# Patient Record
Sex: Male | Born: 1985 | Race: White | Hispanic: No | Marital: Single | Smoking: Current every day smoker
Health system: Southern US, Community
[De-identification: ages and names within clinical notes are randomized; demographics above are authoritative.]

## PROBLEM LIST (undated history)

## (undated) DIAGNOSIS — A4901 Methicillin susceptible Staphylococcus aureus infection, unspecified site: Secondary | ICD-10-CM

## (undated) DIAGNOSIS — F1990 Other psychoactive substance use, unspecified, uncomplicated: Secondary | ICD-10-CM

## (undated) DIAGNOSIS — R7881 Bacteremia: Secondary | ICD-10-CM

---
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
## 2010-12-22 ENCOUNTER — Emergency Department: Payer: Self-pay | Admitting: Emergency Medicine

## 2011-02-11 ENCOUNTER — Emergency Department: Payer: Self-pay | Admitting: Emergency Medicine

## 2011-03-08 ENCOUNTER — Emergency Department (HOSPITAL_COMMUNITY)
Admission: EM | Admit: 2011-03-08 | Discharge: 2011-03-08 | Disposition: A | Discharge status: Home / Self Care | Payer: Self-pay | Attending: Emergency Medicine | Admitting: Emergency Medicine

## 2011-03-08 DIAGNOSIS — R42 Dizziness and giddiness: Secondary | ICD-10-CM | POA: Insufficient documentation

## 2012-06-02 ENCOUNTER — Emergency Department (HOSPITAL_COMMUNITY)
Admission: EM | Admit: 2012-06-02 | Discharge: 2012-06-02 | Discharge status: Against Medical Advice | Payer: Self-pay | Attending: Emergency Medicine | Admitting: Emergency Medicine

## 2012-06-02 ENCOUNTER — Encounter (HOSPITAL_COMMUNITY): Payer: Self-pay | Admitting: *Deleted

## 2012-06-02 DIAGNOSIS — Z5900 Homelessness unspecified: Secondary | ICD-10-CM | POA: Insufficient documentation

## 2012-06-02 DIAGNOSIS — Z008 Encounter for other general examination: Secondary | ICD-10-CM | POA: Insufficient documentation

## 2012-06-02 NOTE — ED Notes (Signed)
Pt states his grandmother is about to get off work and works close to hospital. He left AMA so that he can see if his grandmother can help him since he is homeless, if not pt will return to hospital.

## 2012-06-02 NOTE — ED Notes (Signed)
Pt states he has been smoking medical marijuana and wants to be placed in a rehab facility. States he is homeless, recently lost job and needs help with drugs. Pt anxious in triage.

## 2013-08-01 ENCOUNTER — Encounter (HOSPITAL_COMMUNITY): Payer: Self-pay | Admitting: *Deleted

## 2013-08-01 ENCOUNTER — Emergency Department (HOSPITAL_COMMUNITY)
Admission: EM | Admit: 2013-08-01 | Discharge: 2013-08-01 | Disposition: A | Discharge status: Home / Self Care | Payer: Self-pay | Attending: Emergency Medicine | Admitting: Emergency Medicine

## 2013-08-01 ENCOUNTER — Emergency Department (HOSPITAL_COMMUNITY): Payer: Self-pay

## 2013-08-01 DIAGNOSIS — R059 Cough, unspecified: Secondary | ICD-10-CM | POA: Insufficient documentation

## 2013-08-01 DIAGNOSIS — R093 Abnormal sputum: Secondary | ICD-10-CM | POA: Insufficient documentation

## 2013-08-01 DIAGNOSIS — R05 Cough: Secondary | ICD-10-CM | POA: Insufficient documentation

## 2013-08-01 DIAGNOSIS — F17200 Nicotine dependence, unspecified, uncomplicated: Secondary | ICD-10-CM | POA: Insufficient documentation

## 2013-08-01 DIAGNOSIS — J3489 Other specified disorders of nose and nasal sinuses: Secondary | ICD-10-CM | POA: Insufficient documentation

## 2013-08-01 DIAGNOSIS — R1110 Vomiting, unspecified: Secondary | ICD-10-CM | POA: Insufficient documentation

## 2013-08-01 DIAGNOSIS — J209 Acute bronchitis, unspecified: Secondary | ICD-10-CM | POA: Insufficient documentation

## 2013-08-01 DIAGNOSIS — R062 Wheezing: Secondary | ICD-10-CM | POA: Insufficient documentation

## 2013-08-01 DIAGNOSIS — Z8701 Personal history of pneumonia (recurrent): Secondary | ICD-10-CM | POA: Insufficient documentation

## 2013-08-01 DIAGNOSIS — R0989 Other specified symptoms and signs involving the circulatory and respiratory systems: Secondary | ICD-10-CM | POA: Insufficient documentation

## 2013-08-01 DIAGNOSIS — Z720 Tobacco use: Secondary | ICD-10-CM

## 2013-08-01 DIAGNOSIS — R197 Diarrhea, unspecified: Secondary | ICD-10-CM | POA: Insufficient documentation

## 2013-08-01 MED ORDER — AEROCHAMBER Z-STAT PLUS/MEDIUM MISC: 1.0000 | Freq: Once | Status: DC

## 2013-08-01 MED ORDER — PREDNISONE 20 MG PO TABS: 20.0000 mg | ORAL_TABLET | Freq: Two times a day (BID) | ORAL | Status: DC

## 2013-08-01 MED ORDER — ALBUTEROL SULFATE HFA 108 (90 BASE) MCG/ACT IN AERS
2.0000 | INHALATION_SPRAY | RESPIRATORY_TRACT | Status: DC | PRN
  Administered 2013-08-01: 2 via RESPIRATORY_TRACT
  Filled 2013-08-01: qty 6.7

## 2013-08-01 MED ORDER — PREDNISONE 50 MG PO TABS
60.0000 mg | ORAL_TABLET | Freq: Once | ORAL | Status: AC
  Administered 2013-08-01: 60 mg via ORAL
  Filled 2013-08-01: qty 1

## 2013-08-01 MED ORDER — DOXYCYCLINE HYCLATE 100 MG PO CAPS: 100.0000 mg | ORAL_CAPSULE | Freq: Two times a day (BID) | ORAL | Status: DC

## 2013-08-01 MED ORDER — ALBUTEROL (5 MG/ML) CONTINUOUS INHALATION SOLN: 10.0000 mg/h | INHALATION_SOLUTION | Freq: Once | RESPIRATORY_TRACT | Status: DC

## 2013-08-01 NOTE — ED Provider Notes (Signed)
CSN: [id number]     Arrival date & time 08/01/13  1216 History  This chart was scribed for Flint Melter, MD by Blanchard Kelch and Shari Heritage, ED Scribes. The patient was seen in room APA16A/APA16A. Patient's care was started at 1:22 PM.    Chief Complaint  Patient presents with  . Cough  . Shortness of Breath    The history is provided by the patient. No language interpreter was used.    HPI Comments: Larry Best is a 27 y.o. male who presents to the Emergency Department complaining of moderate, intermittent productive cough that began four days ago. He reports that cough is productive of yellow sputum. Patient is also complaining of persistent wheezing and shortness of breath. Patient complains of associated post-tussis emesis. He also reports three episodes of diarrhea this morning. Patient took a medication last night (Z-quil) to help him sleep. He has history of pnemonia and bronchitis from when he was a child. Patient currently smokes.    History  Substance Use Topics  . Smoking status: Current Every Day Smoker    Types: Cigarettes  . Smokeless tobacco: Not on file  . Alcohol Use: No    Review of Systems  HENT: Positive for congestion.   Respiratory: Positive for cough, shortness of breath and wheezing.   Gastrointestinal: Positive for diarrhea.  All other systems reviewed and are negative.    Allergies  Review of patient's allergies indicates no known allergies.  Home Medications  No current outpatient prescriptions on file. Triage Vitals: BP 133/74  Pulse 82  Temp(Src) 98.1 F (36.7 C) (Oral)  Resp 20  Ht 5\' 8"  (1.727 m)  Wt 195 lb (88.451 kg)  BMI 29.66 kg/m2  SpO2 91%  Physical Exam  Constitutional: He is oriented to person, place, and time.  HENT:  Head: Normocephalic and atraumatic.  Right Ear: External ear normal.  Left Ear: External ear normal.  Nose: Nose normal.  Eyes: Conjunctivae and EOM are normal. Pupils are equal, round, and  reactive to light.  Cardiovascular: Normal rate and regular rhythm.   Pulmonary/Chest: He has wheezes.  Scattered wheezes and rhonchi. Decreased air movement bilaterially  Neurological: He is alert and oriented to person, place, and time.  Skin: Skin is warm and dry.    ED Course  DIAGNOSTIC STUDIES: Oxygen Saturation is 91% on room air, low by my interpretation.    COORDINATION OF CARE: 1:26 PM - Patient presents with productive cough and shortness of breath. Wheezing and rhonchi heard upon auscultation of lungs. Ordered chest x-ray. Will order nebulizer treatment. Patient prescribed prednisone and doxycycline. Patient verbalizes understanding and agrees with treatment plan.  1:40 PM - Patient declined to complete nebulizer treatment so will discharge with prescriptions for prednisone and doxycycline only to treat symptoms and underlying infection.  Procedures (including critical care time)  Dg Chest 2 View  08/01/2013   *RADIOLOGY REPORT*  Clinical Data: Cough and shortness of breath  CHEST - 2 VIEW  Comparison: None.  Findings: Lungs clear.  Heart size and pulmonary vascularity are normal.  No adenopathy.  No bone lesions.  IMPRESSION: No abnormality noted.   Original Report Authenticated By: Bretta Bang, M.D.    1. Bronchitis, acute   2. Tobacco abuse     MDM  Eval. C/w bronchitis. Doubt PNE or PE. Doubt metabolic instability, serious bacterial infection or impending vascular collapse; the patient is stable for discharge.  Nursing Notes Reviewed/ Care Coordinated Applicable Imaging Reviewed Interpretation of Laboratory  Data incorporated into ED treatment   Plan: Home Medications- Doxy., Albuterol, Prednisone; Home Treatments- stop smoking; return here if the recommended treatment, does not improve the symptoms; Recommended follow up- PCP of choice prn   I personally performed the services described in this documentation, which was scribed in my presence. The recorded  information has been reviewed and is accurate.   Flint Melter, MD 08/02/13 [phone]

## 2013-08-01 NOTE — ED Notes (Signed)
MD at bedside. 

## 2013-08-01 NOTE — Discharge Instructions (Signed)
Use the inhaler 2 puffs every 3-4 hours for cough or trouble breathing. Try to stop smoking. Start the prescriptions today.  If the inhaler does not help the cough, use Robitussin DM.       Acute Bronchitis You have acute bronchitis. This means you have a chest cold. The airways in your lungs are red and sore (inflamed). Acute means it is sudden onset.  CAUSES Bronchitis is most often caused by the same virus that causes a cold. SYMPTOMS   Body aches.  Chest congestion.  Chills.  Cough.  Fever.  Shortness of breath.  Sore throat. TREATMENT  Acute bronchitis is usually treated with rest, fluids, and medicines for relief of fever or cough. Most symptoms should go away after a few days or a week. Increased fluids may help thin your secretions and will prevent dehydration. Your caregiver may give you an inhaler to improve your symptoms. The inhaler reduces shortness of breath and helps control cough. You can take over-the-counter pain relievers or cough medicine to decrease coughing, pain, or fever. A cool-air vaporizer may help thin bronchial secretions and make it easier to clear your chest. Antibiotics are usually not needed but can be prescribed if you smoke, are seriously ill, have chronic lung problems, are elderly, or you are at higher risk for developing complications.Allergies and asthma can make bronchitis worse. Repeated episodes of bronchitis may cause longstanding lung problems. Avoid smoking and secondhand smoke.Exposure to cigarette smoke or irritating chemicals will make bronchitis worse. If you are a cigarette smoker, consider using nicotine gum or skin patches to help control withdrawal symptoms. Quitting smoking will help your lungs heal faster. Recovery from bronchitis is often slow, but you should start feeling better after 2 to 3 days. Cough from bronchitis frequently lasts for 3 to 4 weeks. To prevent another bout of acute bronchitis:  Quit smoking.  Wash  your hands frequently to get rid of viruses or use a hand sanitizer.  Avoid other people with cold or virus symptoms.  Try not to touch your hands to your mouth, nose, or eyes. SEEK IMMEDIATE MEDICAL CARE IF:  You develop increased fever, chills, or chest pain.  You have severe shortness of breath or bloody sputum.  You develop dehydration, fainting, repeated vomiting, or a severe headache.  You have no improvement after 1 week of treatment or you get worse. MAKE SURE YOU:   Understand these instructions.  Will watch your condition.  Will get help right away if you are not doing well or get worse. Document Released: 01/06/2005 Document Revised: 02/21/2012 Document Reviewed: 03/24/2011 [facility] Patient Information 2014 [place], [place].  Smoking Hazards Smoking cigarettes is extremely bad for your health. Tobacco smoke has over 200 known poisons in it. There are over 60 chemicals in tobacco smoke that cause cancer. Some of the chemicals found in cigarette smoke include:   Cyanide.  Benzene.  Formaldehyde.  Methanol (wood alcohol).  Acetylene (fuel used in welding torches).  Ammonia. Cigarette smoke also contains the poisonous gases nitrogen oxide and carbon monoxide.  Cigarette smokers have an increased risk of many serious medical problems, including:  Lung cancer.  Lung disease (such as pneumonia, bronchitis, and emphysema).  Heart attack and chest pain due to the heart not getting enough oxygen (angina).  Heart disease and peripheral blood vessel disease.  Hypertension.  Stroke.  Oral cancer (cancer of the lip, mouth, or voice box).  Bladder cancer.  Pancreatic cancer.  Cervical cancer.  Pregnancy complications, including premature birth.  Low birthweight babies.  Early menopause.  Lower estrogen level for women.  Infertility.  Facial wrinkles.  Blindness.  Increased risk of broken bones (fractures).  Senile dementia.  Stillbirths and  smaller newborn babies, birth defects, and genetic damage to sperm.  Stomach ulcers and internal bleeding. Children of smokers have an increased risk of the following, because of secondhand smoke exposure:   Sudden infant death syndrome (SIDS).  Respiratory infections.  Lung cancer.  Heart disease.  Ear infections. Smoking causes approximately:  90% of all lung cancer deaths in men.  80% of all lung cancer deaths in women.  90% of deaths from chronic obstructive lung disease. Compared with nonsmokers, smoking increases the risk of:  Coronary heart disease by 2 to 4 times.  Stroke by 2 to 4 times.  Men developing lung cancer by 23 times.  Women developing lung cancer by 13 times.  Dying from chronic obstructive lung diseases by 12 times. Someone who smokes 2 packs a day loses about 8 years of his or her life. Even smoking lightly shortens your life expectancy by several years. You can greatly reduce the risk of medical problems for you and your family by stopping now. Smoking is the most preventable cause of death and disease in our society. Within days of quitting smoking, your circulation returns to normal, you decrease the risk of having a heart attack, and your lung capacity improves. There may be some increased phlegm in the first few days after quitting, and it may take months for your lungs to clear up completely. Quitting for 10 years cuts your lung cancer risk to almost that of a nonsmoker. WHY IS SMOKING ADDICTIVE?  Nicotine is the chemical agent in tobacco that is capable of causing addiction or dependence.  When you smoke and inhale, nicotine is absorbed rapidly into the bloodstream through your lungs. Nicotine absorbed through the lungs is capable of creating a powerful addiction. Both inhaled and non-inhaled nicotine may be addictive.  Addiction studies of cigarettes and spit tobacco show that addiction to nicotine occurs mainly during the teen years, when young  people begin using tobacco products. WHAT ARE THE BENEFITS OF QUITTING?  There are many health benefits to quitting smoking.   Likelihood of developing cancer and heart disease decreases. Health improvements are seen almost immediately.  Blood pressure, pulse rate, and breathing patterns start returning to normal soon after quitting.  People who quit may see an improvement in their overall quality of life. Some people choose to quit all at once. Other options include nicotine replacement products, such as patches, gum, and nasal sprays. Do not use these products without first checking with your caregiver. QUITTING SMOKING It is not easy to quit smoking. Nicotine is addicting, and longtime habits are hard to change. To start, you can write down all your reasons for quitting, tell your family and friends you want to quit, and ask for their help. Throw your cigarettes away, chew gum or cinnamon sticks, keep your hands busy, and drink extra water or juice. Go for walks and practice deep breathing to relax. Think of all the money you are saving: around $1,000 a year, for the average pack-a-day smoker. Nicotine patches and gum have been shown to improve success at efforts to stop smoking. Zyban (bupropion) is an anti-depressant drug that can be prescribed to reduce nicotine withdrawal symptoms and to suppress the urge to smoke. Smoking is an addiction with both physical and psychological effects. Joining a stop-smoking support group can  help you cope with the emotional issues. For more information and advice on programs to stop smoking, call your doctor, your local hospital, or these organizations:  American Lung Association - 1-800-LUNGUSA  American Cancer Society - 1-800-ACS-2345 Document Released: 01/06/2005 Document Revised: 02/21/2012 Document Reviewed: 09/10/2009 [facility] Patient Information 2014 [place], [place].  Smoking Cessation Quitting smoking is important to your health and has many  advantages. However, it is not always easy to quit since nicotine is a very addictive drug. Often times, people try 3 times or more before being able to quit. This document explains the best ways for you to prepare to quit smoking. Quitting takes hard work and a lot of effort, but you can do it. ADVANTAGES OF QUITTING SMOKING  You will live longer, feel better, and live better.  Your body will feel the impact of quitting smoking almost immediately.  Within 20 minutes, blood pressure decreases. Your pulse returns to its normal level.  After 8 hours, carbon monoxide levels in the blood return to normal. Your oxygen level increases.  After 24 hours, the chance of having a heart attack starts to decrease. Your breath, hair, and body stop smelling like smoke.  After 48 hours, damaged nerve endings begin to recover. Your sense of taste and smell improve.  After 72 hours, the body is virtually free of nicotine. Your bronchial tubes relax and breathing becomes easier.  After 2 to 12 weeks, lungs can hold more air. Exercise becomes easier and circulation improves.  The risk of having a heart attack, stroke, cancer, or lung disease is greatly reduced.  After 1 year, the risk of coronary heart disease is cut in half.  After 5 years, the risk of stroke falls to the same as a nonsmoker.  After 10 years, the risk of lung cancer is cut in half and the risk of other cancers decreases significantly.  After 15 years, the risk of coronary heart disease drops, usually to the level of a nonsmoker.  If you are pregnant, quitting smoking will improve your chances of having a healthy baby.  The people you live with, especially any children, will be healthier.  You will have extra money to spend on things other than cigarettes. QUESTIONS TO THINK ABOUT BEFORE ATTEMPTING TO QUIT You may want to talk about your answers with your caregiver.  Why do you want to quit?  If you tried to quit in the past, what  helped and what did not?  What will be the most difficult situations for you after you quit? How will you plan to handle them?  Who can help you through the tough times? Your family? Friends? A caregiver?  What pleasures do you get from smoking? What ways can you still get pleasure if you quit? Here are some questions to ask your caregiver:  How can you help me to be successful at quitting?  What medicine do you think would be best for me and how should I take it?  What should I do if I need more help?  What is smoking withdrawal like? How can I get information on withdrawal? GET READY  Set a quit date.  Change your environment by getting rid of all cigarettes, ashtrays, matches, and lighters in your home, car, or work. Do not let people smoke in your home.  Review your past attempts to quit. Think about what worked and what did not. GET SUPPORT AND ENCOURAGEMENT You have a better chance of being successful if you have help.  You can get support in many ways.  Tell your family, friends, and co-workers that you are going to quit and need their support. Ask them not to smoke around you.  Get individual, group, or telephone counseling and support. Programs are available at [employer] and health centers. Call your local health department for information about programs in your area.  Spiritual beliefs and practices may help some smokers quit.  Download a "quit meter" on your computer to keep track of quit statistics, such as how long you have gone without smoking, cigarettes not smoked, and money saved.  Get a self-help book about quitting smoking and staying off of tobacco. LEARN NEW SKILLS AND BEHAVIORS  Distract yourself from urges to smoke. Talk to someone, go for a walk, or occupy your time with a task.  Change your normal routine. Take a different route to work. Drink tea instead of coffee. Eat breakfast in a different place.  Reduce your stress. Take a hot bath,  exercise, or read a book.  Plan something enjoyable to do every day. Reward yourself for not smoking.  Explore interactive web-based programs that specialize in helping you quit. GET MEDICINE AND USE IT CORRECTLY Medicines can help you stop smoking and decrease the urge to smoke. Combining medicine with the above behavioral methods and support can greatly increase your chances of successfully quitting smoking.  Nicotine replacement therapy helps deliver nicotine to your body without the negative effects and risks of smoking. Nicotine replacement therapy includes nicotine gum, lozenges, inhalers, nasal sprays, and skin patches. Some may be available over-the-counter and others require a prescription.  Antidepressant medicine helps people abstain from smoking, but how this works is unknown. This medicine is available by prescription.  Nicotinic receptor partial agonist medicine simulates the effect of nicotine in your brain. This medicine is available by prescription. Ask your caregiver for advice about which medicines to use and how to use them based on your health history. Your caregiver will tell you what side effects to look out for if you choose to be on a medicine or therapy. Carefully read the information on the package. Do not use any other product containing nicotine while using a nicotine replacement product.  RELAPSE OR DIFFICULT SITUATIONS Most relapses occur within the first 3 months after quitting. Do not be discouraged if you start smoking again. Remember, most people try several times before finally quitting. You may have symptoms of withdrawal because your body is used to nicotine. You may crave cigarettes, be irritable, feel very hungry, cough often, get headaches, or have difficulty concentrating. The withdrawal symptoms are only temporary. They are strongest when you first quit, but they will go away within 10 14 days. To reduce the chances of relapse, try to:  Avoid drinking  alcohol. Drinking lowers your chances of successfully quitting.  Reduce the amount of caffeine you consume. Once you quit smoking, the amount of caffeine in your body increases and can give you symptoms, such as a rapid heartbeat, sweating, and anxiety.  Avoid smokers because they can make you want to smoke.  Do not let weight gain distract you. Many smokers will gain weight when they quit, usually less than 10 pounds. Eat a healthy diet and stay active. You can always lose the weight gained after you quit.  Find ways to improve your mood other than smoking. FOR MORE INFORMATION  www.smokefree.gov  Document Released: 11/23/2001 Document Revised: 05/30/2012 Document Reviewed: 03/09/2012 [facility] Patient Information 2014 [place], [place].   RESOURCE  GUIDE  Chronic Pain Problems: Contact [facility]  [phone] Patients need to be referred by their primary care doctor.  Insufficient Money for Medicine: Contact [employer] Way:  call [phone]  No Primary Care Doctor: - Call Health Connect  [phone] - can help you locate a primary care doctor that  accepts your insurance, provides certain services, etc. - Physician Referral Service- [phone]  Agencies that provide inexpensive medical care: - [facility]  [phone] - [facility]  [phone] - [facility]  [phone] - Women's Clinic  [phone] - Planned Parenthood  [phone] [facility]  [phone]  Medicaid-accepting [facility] Providers: - [facility]- [address]  [phone], Mon-Fri 9am-7pm, Sat 9am-1pm - [facility]- [address] [place], [address] [place]  [phone] - [facility]- [address] [place]  [phone] [facility]- [address]  [phone] - Renaye Rakers- [address] [place], [address], [phone]  Only accepts [place] patients  after they have their name  applied to their card  Self Pay (no insurance) in [place]: - Sickle Cell Patients - [facility]  [address] [place], [phone] - [facility] Care- [address] [place]  [phone]       [facility] [place]- [address]       -     [facility]- see information above (Speak to [employer] if you do not have insurance)       -  [facility]- [address],  [phone]       -  [facility]- [address] [phone]       -  Dr Julio Sicks-  [address], [place], [phone]       -  [facility] - [address], [phone]       -  [facility]- [address], [phone], also [address], [phone]       -     [facility]- [address] [place], [phone], 1st & 3rd Saturday         every month, 10am-1pm  -     [facility]   [address] [place], [place].   Phone:  [phone], Fax:  [phone]. Hours of Operation:  9 am - 6 pm, M-F.  -     [facility]   [address], [place]   Phone: [phone], Fax: [phone]. Hours of Operation:  8:30 am - 5:30 pm, M-F.  [facility] [address] [place], [place] [address] [phone]  [facility] [address] [place], [place] [address] [phone]  1) Find a Doctor and Pay Out of Pocket Although you won't have to find out who is covered by your insurance plan, it is a good idea to ask around and get recommendations. You will then need to call the office and see if the doctor you have chosen will accept you as a new patient and what types of options they offer for patients who are self-pay. Some doctors offer discounts or will set up payment plans for their patients who do not have insurance, but you  will need to ask so you aren't surprised when you get to  your appointment.  2) Contact Your Local Health Department Not all health departments have doctors that can see patients for sick visits, but many do, so it is worth a call to see if yours does. If you don't know where your local health department is, you can check in your phone book. The CDC also has a tool to help you locate your state's health department, and many state websites also have listings of all of their local health departments.  3) Find a Walk-in Clinic If your illness is not likely to be very severe or complicated, you may want to try a walk in clinic. These are popping up all over the country in pharmacies, drugstores, and shopping centers. They're usually staffed by nurse practitioners or physician assistants that have been trained to treat common illnesses and complaints. They're usually fairly quick and inexpensive. However, if you have serious medical issues or chronic medical problems, these are probably not your best option  STD Testing - [facility] Department of [facility], STD Clinic, [address] [place], phone [phone] or [phone].  Monday - Friday, call for an appointment. [facility] Department of [employer], STD Clinic, [address], [place], phone [phone] or [phone].  Monday - Friday, call for an appointment.  Abuse/Neglect: [facility] Child Abuse Hotline 5862004914 [facility] Child Abuse Hotline [phone] (After Hours)  Emergency Shelter:  [facility] 817-323-6659  Maternity Homes: - Room at the [place] [facility] [phone] - [facility] ([phone]  MRSA Hotline #:   (878)242-0105  Dental Assistance If unable to pay or uninsured, contact:  [facility]. to become qualified for the adult dental clinic.  Patients with Medicaid: [facility] [phone] [address], [phone] [address],  [phone]  If unable to pay, or uninsured, contact [facility] [phone] in [place], [phone] in [facility]) to become qualified for the adult dental clinic  [facility] [address] [place], [place] [address] [phone] [email or web address]  Other Proofreader Services: - [facility]- [address] [place], [place], [place], [address], [phone], Ext. [phone], 2nd and 4th Thursday of the month at 6:30am.  10 clients each day by appointment, can sometimes see walk-in patients if someone does not show for an appointment. [facility]- [address] [place], [place], [address], [phone] - [facility] [address] [place], [place], [address], [phone] - [facility]- [phone] [facility] Health Department- [phone] [facility] Health [phone]       Behavioral Health Resources in the [facility]  Intensive Outpatient Programs: [facility]      [address] [place], [place] [phone] Both a day and evening program       [facility] Outpatient     [address]        [place], [place] [address] [phone]         ADS: [facility] [address] [place] [phone]  [facility] Mental Health ACCESS LINE: (208) [phone] or [phone] [address] [place], [place] [address] [email or web address]   Substance Abuse Resources: - [facility]  [phone] - [facility] [phone] - [facility] [place] [phone] [facility] [phone] -  Residential & Outpatient Substance Abuse Program  [phone]  Psychological Services: [facility]  [phone] Services  [phone] - [facility], [phone] [place]. [address] [place], ACCESS LINE: (339)478-5746 or [phone],  [email or web address]  Mobile Crisis Teams:                                        [facility]         Mobile Crisis Care Unit [phone]             [facility] [address] [place] Otto [phone]                                         Interventionist [address] [place] [phone]  Self-Help/Support Groups: Mental Health Assoc. of [employer] of support groups (707)[phone] (call for more info)  Narcotics Anonymous (NA) [facility] [address] [place] - 2 meetings at this location  Residential Treatment Programs:  [facility]      [address]        [place]       [phone]         [facility] [address], [place] [address] [place], [place]  [address] [phone]  [facility]  [address] [place], [place] [address] [phone] Admissions: 8am-3pm M-F  [facility]     [address]        [place], [place] [address]       [phone]         [facility] [address] [place], [place] [phone]  [facility] [address] [place], [place] [phone]  [facility] - Intensive Outpatient      [address]     [place], [place]       [phone]         [facility] ([facility]     [address] [place] [address] [place], [place] [phone] or [phone]  Residential Treatment Services (RTS), Medicaid [address] [place], [place] [phone]  [facility] [address]                                               [address] [place] [phone]  [facility] Resources: [phone]               General Therapy                                                Angie Fava, PhD        [address] [place], [place] [address]         [phone]   Insurance  Patrcia Dolly  [facility]al   [address] [place], [place] [address] [phone]  [facility] [address] [place], [place] [address] [phone] Insurance/Medicaid/sponsorship through [facility] and Families                                              [address]                                        [place], [place] [address]    Therapy/tele-psych/case         [phone]          [facility] [address], [place]  [address]  Adolescent/group home/case management [phone]                                           Creola Corn PhD       General therapy       Insurance   [phone]         Dr. Lolly Mustache, Insurance, M-F [phone]  [facility]  [employer] [facility] Dept. [address]                 [address]         [address] [place] [address]  Blondell Reveal Phone:  [phone]                                  Phone:  [phone]                   Phone:  [phone]  [facility], [phone] - [facility]- [phone]       -     [facility] [place], [address],             [phone], Insurance  [place] Child Abuse Hotline [phone] or [phone] (After Hours)

## 2013-08-01 NOTE — ED Notes (Signed)
Patient states he does not want to take the hour long breathing treatment. States he would rather just have the inhaler and go. MD made aware. Awaiting discharge papers.

## 2013-08-01 NOTE — ED Notes (Signed)
Pt states sob and productive cough, yellow in color and blood streaked. Pt states fever at times. Continual cough in triage.

## 2014-03-21 ENCOUNTER — Encounter (HOSPITAL_COMMUNITY): Payer: Self-pay | Admitting: Emergency Medicine

## 2014-03-21 ENCOUNTER — Emergency Department (HOSPITAL_COMMUNITY)
Admission: EM | Admit: 2014-03-21 | Discharge: 2014-03-21 | Disposition: A | Discharge status: Home / Self Care | Payer: Self-pay | Attending: Emergency Medicine | Admitting: Emergency Medicine

## 2014-03-21 DIAGNOSIS — R197 Diarrhea, unspecified: Secondary | ICD-10-CM | POA: Insufficient documentation

## 2014-03-21 DIAGNOSIS — R112 Nausea with vomiting, unspecified: Secondary | ICD-10-CM | POA: Insufficient documentation

## 2014-03-21 DIAGNOSIS — R109 Unspecified abdominal pain: Secondary | ICD-10-CM | POA: Insufficient documentation

## 2014-03-21 DIAGNOSIS — F17200 Nicotine dependence, unspecified, uncomplicated: Secondary | ICD-10-CM | POA: Insufficient documentation

## 2014-03-21 LAB — CBC WITH DIFFERENTIAL/PLATELET
BASOS ABS: 0.1 10*3/uL (ref 0.0–0.1)
BASOS PCT: 1 % (ref 0–1)
EOS ABS: 0.3 10*3/uL (ref 0.0–0.7)
Eosinophils Relative: 3 % (ref 0–5)
HCT: 46.4 % (ref 39.0–52.0)
Hemoglobin: 17 g/dL (ref 13.0–17.0)
Lymphocytes Relative: 26 % (ref 12–46)
Lymphs Abs: 2.7 10*3/uL (ref 0.7–4.0)
MCH: 34.5 pg — AB (ref 26.0–34.0)
MCHC: 36.6 g/dL — AB (ref 30.0–36.0)
MCV: 94.1 fL (ref 78.0–100.0)
Monocytes Absolute: 1 10*3/uL (ref 0.1–1.0)
Monocytes Relative: 9 % (ref 3–12)
NEUTROS ABS: 6.3 10*3/uL (ref 1.7–7.7)
NEUTROS PCT: 61 % (ref 43–77)
PLATELETS: 210 10*3/uL (ref 150–400)
RBC: 4.93 MIL/uL (ref 4.22–5.81)
RDW: 13.4 % (ref 11.5–15.5)
WBC: 10.2 10*3/uL (ref 4.0–10.5)

## 2014-03-21 LAB — URINALYSIS, ROUTINE W REFLEX MICROSCOPIC
Bilirubin Urine: NEGATIVE
GLUCOSE, UA: NEGATIVE mg/dL
HGB URINE DIPSTICK: NEGATIVE
KETONES UR: NEGATIVE mg/dL
LEUKOCYTES UA: NEGATIVE
Nitrite: NEGATIVE
PH: 7 (ref 5.0–8.0)
Protein, ur: NEGATIVE mg/dL
Specific Gravity, Urine: 1.022 (ref 1.005–1.030)
Urobilinogen, UA: 0.2 mg/dL (ref 0.0–1.0)

## 2014-03-21 LAB — COMPREHENSIVE METABOLIC PANEL
ALBUMIN: 4.1 g/dL (ref 3.5–5.2)
ALK PHOS: 83 U/L (ref 39–117)
ALT: 35 U/L (ref 0–53)
AST: 31 U/L (ref 0–37)
BUN: 13 mg/dL (ref 6–23)
CHLORIDE: 101 meq/L (ref 96–112)
CO2: 25 mEq/L (ref 19–32)
Calcium: 9.5 mg/dL (ref 8.4–10.5)
Creatinine, Ser: 0.92 mg/dL (ref 0.50–1.35)
GFR calc Af Amer: >90 mL/min (ref >90)
GFR calc non Af Amer: >90 mL/min (ref >90)
Glucose, Bld: 104 mg/dL — ABNORMAL HIGH (ref 70–99)
POTASSIUM: 4.2 meq/L (ref 3.7–5.3)
SODIUM: 141 meq/L (ref 137–147)
TOTAL PROTEIN: 7.1 g/dL (ref 6.0–8.3)
Total Bilirubin: 0.6 mg/dL (ref 0.3–1.2)

## 2014-03-21 MED ORDER — ONDANSETRON 4 MG PO TBDP
8.0000 mg | ORAL_TABLET | Freq: Three times a day (TID) | ORAL | Status: DC | PRN
Start: 2014-03-21 — End: 2016-11-28

## 2014-03-21 MED ORDER — ONDANSETRON 4 MG PO TBDP
8.0000 mg | ORAL_TABLET | Freq: Once | ORAL | Status: AC
  Administered 2014-03-21: 8 mg via ORAL
  Filled 2014-03-21: qty 2

## 2014-03-21 NOTE — ED Provider Notes (Signed)
Medical screening examination/treatment/procedure(s) were performed by non-physician practitioner and as supervising physician I was immediately available for consultation/collaboration.   EKG Interpretation None        Vinicius Brockman, MD 03/21/14 1516 

## 2014-03-21 NOTE — ED Provider Notes (Signed)
CSN: [id number]     Arrival date & time 03/21/14  0906 History   First MD Initiated Contact with Patient 03/21/14 1046     Chief Complaint  Patient presents with  . Abdominal Pain     (Consider location/radiation/quality/duration/timing/severity/associated sxs/prior Treatment) HPI Comments: Patient is a the central canal past medical history significant for tobacco use presented to the emergency department for acute onset nausea, nonbloody nonbilious vomiting, nonbloody diarrhea with epigastric and suprapubic abdominal discomfort that began last evening. Patient states he was informed by his work since he was unable to go he would need a doctor's note. He does endorse continued nausea and emesis this morning with last episode of emesis around 6:30 AM. He states his abdominal pain has improved and is mild in comparison to last evening. He denies any fevers or chills or urinary symptoms. Patient states that the GI bug has been going around within his family. No abdominal surgical history.  Patient is a 28 y.o. male presenting with abdominal pain.  Abdominal Pain Associated symptoms: diarrhea, nausea and vomiting   Associated symptoms: no chills and no fever     History reviewed. No pertinent past medical history. History reviewed. No pertinent past surgical history. History reviewed. No pertinent family history. History  Substance Use Topics  . Smoking status: Current Every Day Smoker    Types: Cigarettes  . Smokeless tobacco: Not on file  . Alcohol Use: Yes    Review of Systems  Constitutional: Negative for fever and chills.  Gastrointestinal: Positive for nausea, vomiting, abdominal pain and diarrhea.  All other systems reviewed and are negative.     Allergies  Review of patient's allergies indicates no known allergies.  Home Medications   Current Outpatient Rx  Name  Route  Sig  Dispense  Refill  . ibuprofen (ADVIL,MOTRIN) 200 MG tablet   Oral   Take 800 mg by mouth  every 6 (six) hours as needed for pain.         Marland Kitchen. ondansetron (ZOFRAN ODT) 4 MG disintegrating tablet   Oral   Take 2 tablets (8 mg total) by mouth every 8 (eight) hours as needed for nausea or vomiting.   12 tablet   0    BP 139/76  Pulse 76  Temp(Src) 98.1 F (36.7 C) (Oral)  Resp 16  Ht 5\' 8"  (1.727 m)  Wt 210 lb (95.255 kg)  BMI 31.94 kg/m2  SpO2 99% Physical Exam  Nursing note and vitals reviewed. Constitutional: He is oriented to person, place, and time. He appears well-developed and well-nourished. No distress.  HENT:  Head: Normocephalic and atraumatic.  Right Ear: External ear normal.  Left Ear: External ear normal.  Nose: Nose normal.  Mouth/Throat: Oropharynx is clear and moist. No oropharyngeal exudate.  Eyes: Conjunctivae are normal.  Neck: Normal range of motion. Neck supple.  Cardiovascular: Normal rate, regular rhythm and normal heart sounds.   Pulmonary/Chest: Effort normal and breath sounds normal. No respiratory distress.  Abdominal: Soft. Bowel sounds are normal. He exhibits no distension. There is no tenderness. There is no rebound and no guarding.  Musculoskeletal: Normal range of motion.  Neurological: He is alert and oriented to person, place, and time.  Skin: Skin is warm and dry. He is not diaphoretic.  Psychiatric: He has a normal mood and affect.    ED Course  Procedures (including critical care time) Medications  ondansetron (ZOFRAN-ODT) disintegrating tablet 8 mg (8 mg Oral Given 03/21/14 1105)    Labs Review  Labs Reviewed  CBC WITH DIFFERENTIAL - Abnormal; Notable for the following:    MCH 34.5 (*)    MCHC 36.6 (*)    All other components within normal limits  COMPREHENSIVE METABOLIC PANEL - Abnormal; Notable for the following:    Glucose, Bld 104 (*)    All other components within normal limits  URINALYSIS, ROUTINE W REFLEX MICROSCOPIC   Imaging Review No results found.   EKG Interpretation None      MDM   Final  diagnoses:  Nausea vomiting and diarrhea  Abdominal pain    Filed Vitals:   03/21/14 1236  BP: 139/76  Pulse: 76  Temp: 98.1 F (36.7 C)  Resp: 16   Afebrile, NAD, non-toxic appearing, AAOx4. Abdomen soft, nontender, nondistended. No peritoneal signs. Patient with symptoms consistent with viral gastroenteritis.  Vitals are stable, no fever.  No signs of dehydration, tolerating PO fluids > 6 oz.  Lungs are clear.  No focal abdominal pain, no concern for appendicitis, cholecystitis, pancreatitis, ruptured viscus, UTI, kidney stone, or any other abdominal etiology.  Supportive therapy indicated with return if symptoms worsen.  Patient counseled. Patient is agreeable to plan. Patient is stable at time of discharge.     Lise Auer Lamyah Creed, PA-C 03/21/14 1440

## 2014-03-21 NOTE — ED Notes (Signed)
Pt reports abd pain, v/d since yesterday. He had to miss work yesterday and they told him he must have a work note to return. He says he still has some nausea and abd pain this am

## 2014-03-21 NOTE — Discharge Instructions (Signed)
Please follow up with your primary care physician in 1-2 days. If you do not have one please call the [facility] number listed above. Please take Zofran as prescribed. Please read all discharge instructions and return precautions.   Viral Gastroenteritis Viral gastroenteritis is also known as stomach flu. This condition affects the stomach and intestinal tract. It can cause sudden diarrhea and vomiting. The illness typically lasts 3 to 8 days. Most people develop an immune response that eventually gets rid of the virus. While this natural response develops, the virus can make you quite ill. CAUSES  Many different viruses can cause gastroenteritis, such as rotavirus or noroviruses. You can catch one of these viruses by consuming contaminated food or water. You may also catch a virus by sharing utensils or other personal items with an infected person or by touching a contaminated surface. SYMPTOMS  The most common symptoms are diarrhea and vomiting. These problems can cause a severe loss of body fluids (dehydration) and a body salt (electrolyte) imbalance. Other symptoms may include:  Fever.  Headache.  Fatigue.  Abdominal pain. DIAGNOSIS  Your caregiver can usually diagnose viral gastroenteritis based on your symptoms and a physical exam. A stool sample may also be taken to test for the presence of viruses or other infections. TREATMENT  This illness typically goes away on its own. Treatments are aimed at rehydration. The most serious cases of viral gastroenteritis involve vomiting so severely that you are not able to keep fluids down. In these cases, fluids must be given through an intravenous line (IV). HOME CARE INSTRUCTIONS   Drink enough fluids to keep your urine clear or pale yellow. Drink small amounts of fluids frequently and increase the amounts as tolerated.  Ask your caregiver for specific rehydration instructions.  Avoid:  Foods high in  sugar.  Alcohol.  Carbonated drinks.  Tobacco.  Juice.  Caffeine drinks.  Extremely hot or cold fluids.  Fatty, greasy foods.  Too much intake of anything at one time.  Dairy products until 24 to 48 hours after diarrhea stops.  You may consume probiotics. Probiotics are active cultures of beneficial bacteria. They may lessen the amount and number of diarrheal stools in adults. Probiotics can be found in yogurt with active cultures and in supplements.  Wash your hands well to avoid spreading the virus.  Only take over-the-counter or prescription medicines for pain, discomfort, or fever as directed by your caregiver. Do not give aspirin to children. Antidiarrheal medicines are not recommended.  Ask your caregiver if you should continue to take your regular prescribed and over-the-counter medicines.  Keep all follow-up appointments as directed by your caregiver. SEEK IMMEDIATE MEDICAL CARE IF:   You are unable to keep fluids down.  You do not urinate at least once every 6 to 8 hours.  You develop shortness of breath.  You notice blood in your stool or vomit. This may look like coffee grounds.  You have abdominal pain that increases or is concentrated in one small area (localized).  You have persistent vomiting or diarrhea.  You have a fever.  The patient is a child younger than 3 months, and he or she has a fever.  The patient is a child older than 3 months, and he or she has a fever and persistent symptoms.  The patient is a child older than 3 months, and he or she has a fever and symptoms suddenly get worse.  The patient is a baby, and he or  she has no tears when crying. MAKE SURE YOU:   Understand these instructions.  Will watch your condition.  Will get help right away if you are not doing well or get worse. Document Released: 11/29/2005 Document Revised: 02/21/2012 Document Reviewed: 09/15/2011 [facility] Patient Information 2014 [place].

## 2014-03-21 NOTE — Discharge Planning (Signed)
[facility] Larry Best, [facility]iaison  Spoke to patient about primary care resources and establishing care with a provider. [facility] primary care resources given. Patient states [place] now is living in [place]. My contact information was also provided for any future questions or concerns.

## 2014-10-15 ENCOUNTER — Emergency Department (HOSPITAL_COMMUNITY)
Admission: EM | Admit: 2014-10-15 | Discharge: 2014-10-15 | Disposition: A | Discharge status: Home / Self Care | Payer: Worker's Compensation | Attending: Emergency Medicine | Admitting: Emergency Medicine

## 2014-10-15 ENCOUNTER — Encounter (HOSPITAL_COMMUNITY): Payer: Self-pay | Admitting: Emergency Medicine

## 2014-10-15 ENCOUNTER — Emergency Department (HOSPITAL_COMMUNITY): Payer: Worker's Compensation

## 2014-10-15 DIAGNOSIS — Z23 Encounter for immunization: Secondary | ICD-10-CM | POA: Diagnosis not present

## 2014-10-15 DIAGNOSIS — S62612A Displaced fracture of proximal phalanx of right middle finger, initial encounter for closed fracture: Secondary | ICD-10-CM | POA: Insufficient documentation

## 2014-10-15 DIAGNOSIS — M79643 Pain in unspecified hand: Secondary | ICD-10-CM

## 2014-10-15 DIAGNOSIS — Z720 Tobacco use: Secondary | ICD-10-CM | POA: Diagnosis not present

## 2014-10-15 DIAGNOSIS — Y9389 Activity, other specified: Secondary | ICD-10-CM | POA: Diagnosis not present

## 2014-10-15 DIAGNOSIS — S62609A Fracture of unspecified phalanx of unspecified finger, initial encounter for closed fracture: Secondary | ICD-10-CM

## 2014-10-15 DIAGNOSIS — Y990 Civilian activity done for income or pay: Secondary | ICD-10-CM | POA: Insufficient documentation

## 2014-10-15 DIAGNOSIS — S6991XA Unspecified injury of right wrist, hand and finger(s), initial encounter: Secondary | ICD-10-CM | POA: Diagnosis present

## 2014-10-15 DIAGNOSIS — W230XXA Caught, crushed, jammed, or pinched between moving objects, initial encounter: Secondary | ICD-10-CM | POA: Insufficient documentation

## 2014-10-15 DIAGNOSIS — Y9289 Other specified places as the place of occurrence of the external cause: Secondary | ICD-10-CM | POA: Diagnosis not present

## 2014-10-15 MED ORDER — HYDROCODONE-ACETAMINOPHEN 5-325 MG PO TABS: 1.0000 | ORAL_TABLET | Freq: Three times a day (TID) | ORAL | Status: DC | PRN

## 2014-10-15 MED ORDER — IBUPROFEN 800 MG PO TABS
800.0000 mg | ORAL_TABLET | Freq: Once | ORAL | Status: AC
  Administered 2014-10-15: 800 mg via ORAL
  Filled 2014-10-15: qty 1

## 2014-10-15 MED ORDER — HYDROCODONE-ACETAMINOPHEN 5-325 MG PO TABS
2.0000 | ORAL_TABLET | Freq: Once | ORAL | Status: AC
  Administered 2014-10-15: 2 via ORAL
  Filled 2014-10-15: qty 2

## 2014-10-15 MED ORDER — TETANUS-DIPHTH-ACELL PERTUSSIS 5-2.5-18.5 LF-MCG/0.5 IM SUSP
0.5000 mL | Freq: Once | INTRAMUSCULAR | Status: AC
  Administered 2014-10-15: 0.5 mL via INTRAMUSCULAR
  Filled 2014-10-15: qty 0.5

## 2014-10-15 MED ORDER — IBUPROFEN 800 MG PO TABS: 800.0000 mg | ORAL_TABLET | Freq: Three times a day (TID) | ORAL | Status: DC

## 2014-10-15 NOTE — Discharge Instructions (Signed)
Finger Fracture  Mr. Frazier ButtBenfield, you were seen today after an injury to her hand. Keep the splint on and elevate the hand to reduce swelling. Follow-up with the hand surgeon within 3 days for continued treatment. Continue to ice the hand and take Motrin around-the-clock. Take Norco as needed for severe pain. If any of his symptoms worsen especially if he had severe pain, worsening swelling, complete numbness come back to the emergency department immediately for repeat evaluation. Thank you. A finger fracture is when one or more bones in the finger break.  HOME CARE   Wear the splint, tape, or cast as long as told by your doctor.  Keep your fingers in the position your doctor tell you to.  Raise (elevate) the injured area above the level of the heart.  Only take medicine as told by your doctor.  Put ice on the injured area.  Put ice in a plastic bag.  Place a towel between the skin and the bag.  Leave the ice on for 15-20 minutes, 03-04 times a day.  Follow up with your doctor.  Ask what exercises you can do when the splint comes off. GET HELP RIGHT AWAY IF:   The fingernails are white or bluish.  You have pain not helped by medicine.  You cannot move your fingertips.  You lose feeling (numbness) in the injured finger(s). MAKE SURE YOU:   Understand these instructions.  Will watch this condition.  Will get help right away if you are not doing well or get worse. Document Released: 05/17/2008 Document Revised: 02/21/2012 Document Reviewed: 05/17/2008 [facility] Patient Information 2015 [place] [place]. This information is not intended to replace advice given to you by your health care provider. Make sure you discuss any questions you have with your health care provider.

## 2014-10-15 NOTE — ED Provider Notes (Addendum)
CSN: [id number]     Arrival date & time 10/15/14  0403 History   First MD Initiated Contact with Patient 10/15/14 0408     No chief complaint on file.    (Consider location/radiation/quality/duration/timing/severity/associated sxs/prior Treatment) HPI Larry Best is a 28 y.o. male with no significant past medical history coming in with hand pain. Patient was at work and got his hand caught in a roller. His pain is worse over the second through fourth digits on the right hand. He has numbness and tingling and difficulty moving them. He also has a small abrasion to the left digit. His last tetanus shot was 5 years ago. He denies injury anywhere else. Patient has no further complaints.  10 Systems reviewed and are negative for acute change except as noted in the HPI.     No past medical history on file. No past surgical history on file. No family history on file. History  Substance Use Topics  . Smoking status: Current Every Day Smoker    Types: Cigarettes  . Smokeless tobacco: Not on file  . Alcohol Use: Yes    Review of Systems    Allergies  Review of patient's allergies indicates no known allergies.  Home Medications   Prior to Admission medications   Medication Sig Start Date End Date Taking? Authorizing Provider  ibuprofen (ADVIL,MOTRIN) 200 MG tablet Take 800 mg by mouth every 6 (six) hours as needed for pain.    Historical Provider, MD  ondansetron (ZOFRAN ODT) 4 MG disintegrating tablet Take 2 tablets (8 mg total) by mouth every 8 (eight) hours as needed for nausea or vomiting. 03/21/14   Jennifer L Piepenbrink, PA-C   There were no vitals taken for this visit. Physical Exam  Constitutional: He is oriented to person, place, and time. Vital signs are normal. He appears well-developed and well-nourished.  Non-toxic appearance. He does not appear ill. No distress.  HENT:  Head: Normocephalic and atraumatic.  Nose: Nose normal.  Mouth/Throat: Oropharynx is clear  and moist. No oropharyngeal exudate.  Eyes: Conjunctivae and EOM are normal. Pupils are equal, round, and reactive to light. No scleral icterus.  Neck: Normal range of motion. Neck supple. No tracheal deviation, no edema, no erythema and normal range of motion present. No thyroid mass and no thyromegaly present.  Cardiovascular: Normal rate, regular rhythm, S1 normal, S2 normal, normal heart sounds, intact distal pulses and normal pulses.  Exam reveals no gallop and no friction rub.   No murmur heard. Pulses:      Radial pulses are 2+ on the right side, and 2+ on the left side.       Dorsalis pedis pulses are 2+ on the right side, and 2+ on the left side.  Pulmonary/Chest: Effort normal and breath sounds normal. No respiratory distress. He has no wheezes. He has no rhonchi. He has no rales.  Abdominal: Soft. Normal appearance and bowel sounds are normal. He exhibits no distension, no ascites and no mass. There is no hepatosplenomegaly. There is no tenderness. There is no rebound, no guarding and no CVA tenderness.  Musculoskeletal: He exhibits edema and tenderness.  Right second through fourth digits with swelling and tenderness to palpation. Limited range of motion of the digits. Small 0.5 cm abrasion to the thirddigit.no deep laceration seen.  Lymphadenopathy:    He has no cervical adenopathy.  Neurological: He is alert and oriented to person, place, and time. He has normal strength. No cranial nerve deficit or sensory deficit. GCS  eye subscore is 4. GCS verbal subscore is 5. GCS motor subscore is 6.  Skin: Skin is warm, dry and intact. No petechiae and no rash noted. He is not diaphoretic. No erythema. No pallor.  Psychiatric: He has a normal mood and affect. His behavior is normal. Judgment normal.  Nursing note and vitals reviewed.   ED Course  Procedures (including critical care time) Labs Review Labs Reviewed - No data to display  Imaging Review Dg Hand Complete Right  10/15/2014    CLINICAL DATA:  And cough and a roller at work. Laceration to the middle finger. Pain and difficulty with flexion.  EXAM: RIGHT HAND - COMPLETE 3+ VIEW  COMPARISON:  None.  FINDINGS: Soft tissue laceration to the midportion of the right third finger. No radiopaque foreign bodies demonstrated. Small bone fragment adjacent to the articular surface of the base of the proximal phalanx of the third finger consistent with an avulsion fracture.  IMPRESSION: Avulsion fracture of the base of the proximal phalanx of the right third finger. Soft tissue injury to the right third finger.   Electronically Signed   By: Burman NievesWilliam  Stevens M.D.   On: 10/15/2014 04:45     EKG Interpretation None      MDM   Final diagnoses:  Hand pain    Patient presents emergency department for hand pain after an accident at work. He was given Norco, tetanus shot was updated, will evaluate with x-ray of hand.  X-ray reveals avulsion fracture of the proximal phalanx of the right third digit. No other fracture seen. On repeat exam his pain is improved with Norco.splint was applied. Return precautions for compartment syndrome given. His vital signs remain within his normal limits, there was no tachycardia on my exam with HR in the 90s.  Patient is safe for discharge.  SPLINT APPLICATION Date/Time: 6:48 AM Authorized by: Tomasita CrumbleNI,Khalik Pewitt Consent: Verbal consent obtained. Risks and benefits: risks, benefits and alternatives were discussed Consent given by: patient Splint applied by: orthopedic technician Location details: right third digit Splint type: static finger splint Supplies used: finger splint Post-procedure: The splinted body part was neurovascularly unchanged following the procedure. Patient tolerance: Patient tolerated the procedure well with no immediate complications.      Tomasita CrumbleAdeleke Gerilyn Stargell, MD 10/15/14 [phone]  Tomasita CrumbleAdeleke Earsie Humm, MD 10/15/14 [phone]

## 2014-10-15 NOTE — ED Notes (Signed)
Patient here with complaint of right hand injury after getting hand caught in piece of machinery at work. Hand was rolled into machine. Superficial cut noted between 3rd and 4th finger. Fingers of right hand are swollen.

## 2014-12-07 ENCOUNTER — Emergency Department (HOSPITAL_COMMUNITY)
Admission: EM | Admit: 2014-12-07 | Discharge: 2014-12-07 | Disposition: A | Discharge status: Home / Self Care | Payer: BC Managed Care – PPO

## 2014-12-07 NOTE — ED Notes (Signed)
Pt called twice. No answer 

## 2014-12-07 NOTE — ED Notes (Signed)
Pt called 2 times. No answer.

## 2015-07-09 ENCOUNTER — Emergency Department (HOSPITAL_COMMUNITY)
Admission: EM | Admit: 2015-07-09 | Discharge: 2015-07-09 | Disposition: A | Discharge status: Home / Self Care | Payer: Self-pay | Attending: Emergency Medicine | Admitting: Emergency Medicine

## 2015-07-09 ENCOUNTER — Encounter (HOSPITAL_COMMUNITY): Payer: Self-pay | Admitting: Emergency Medicine

## 2015-07-09 DIAGNOSIS — Z791 Long term (current) use of non-steroidal anti-inflammatories (NSAID): Secondary | ICD-10-CM | POA: Insufficient documentation

## 2015-07-09 DIAGNOSIS — K029 Dental caries, unspecified: Secondary | ICD-10-CM | POA: Insufficient documentation

## 2015-07-09 DIAGNOSIS — Z720 Tobacco use: Secondary | ICD-10-CM | POA: Insufficient documentation

## 2015-07-09 MED ORDER — OXYCODONE-ACETAMINOPHEN 5-325 MG PO TABS
1.0000 | ORAL_TABLET | Freq: Once | ORAL | Status: AC
  Administered 2015-07-09: 1 via ORAL
  Filled 2015-07-09: qty 1

## 2015-07-09 MED ORDER — OXYCODONE-ACETAMINOPHEN 5-325 MG PO TABS: 1.0000 | ORAL_TABLET | ORAL | Status: DC | PRN

## 2015-07-09 MED ORDER — AMOXICILLIN 500 MG PO CAPS: 1000.0000 mg | ORAL_CAPSULE | Freq: Two times a day (BID) | ORAL | Status: DC

## 2015-07-09 MED ORDER — AMOXICILLIN 250 MG PO CAPS
1000.0000 mg | ORAL_CAPSULE | Freq: Once | ORAL | Status: AC
  Administered 2015-07-09: 1000 mg via ORAL
  Filled 2015-07-09: qty 4

## 2015-07-09 NOTE — ED Notes (Signed)
Pt c/o dental pain after tooth broke in half yesterday.

## 2015-07-09 NOTE — Discharge Instructions (Signed)
Dental Caries °Dental caries (also called tooth decay) is the most common oral disease. It can occur at any age but is more common in children and young adults.  °HOW DENTAL CARIES DEVELOPS  °The process of decay begins when bacteria and foods (particularly sugars and starches) combine in your mouth to produce plaque. Plaque is a substance that sticks to the hard, outer surface of a tooth (enamel). The bacteria in plaque produce acids that attack enamel. These acids may also attack the root surface of a tooth (cementum) if it is exposed. Repeated attacks dissolve these surfaces and create holes in the tooth (cavities). If left untreated, the acids destroy the other layers of the tooth.  °RISK FACTORS °· Frequent sipping of sugary beverages.   °· Frequent snacking on sugary and starchy foods, especially those that easily get stuck in the teeth.   °· Poor oral hygiene.   °· Dry mouth.   °· Substance abuse such as methamphetamine abuse.   °· Broken or poor-fitting dental restorations.   °· Eating disorders.   °· Gastroesophageal reflux disease (GERD).   °· Certain radiation treatments to the head and neck. °SYMPTOMS °In the early stages of dental caries, symptoms are seldom present. Sometimes white, chalky areas may be seen on the enamel or other tooth layers. In later stages, symptoms may include: °· Pits and holes on the enamel. °· Toothache after sweet, hot, or cold foods or drinks are consumed. °· Pain around the tooth. °· Swelling around the tooth. °DIAGNOSIS  °Most of the time, dental caries is detected during a regular dental checkup. A diagnosis is made after a thorough medical and dental history is taken and the surfaces of your teeth are checked for signs of dental caries. Sometimes special instruments, such as lasers, are used to check for dental caries. Dental X-ray exams may be taken so that areas not visible to the eye (such as between the contact areas of the teeth) can be checked for cavities.    °TREATMENT  °If dental caries is in its early stages, it may be reversed with a fluoride treatment or an application of a remineralizing agent at the dental office. Thorough brushing and flossing at home is needed to aid these treatments. If it is in its later stages, treatment depends on the location and extent of tooth destruction:  °· If a small area of the tooth has been destroyed, the destroyed area will be removed and cavities will be filled with a material such as gold, silver amalgam, or composite resin.   °· If a large area of the tooth has been destroyed, the destroyed area will be removed and a cap (crown) will be fitted over the remaining tooth structure.   °· If the center part of the tooth (pulp) is affected, a procedure called a root canal will be needed before a filling or crown can be placed.   °· If most of the tooth has been destroyed, the tooth may need to be pulled (extracted). °HOME CARE INSTRUCTIONS °You can prevent, stop, or reverse dental caries at home by practicing good oral hygiene. Good oral hygiene includes: °· Thoroughly cleaning your teeth at least twice a day with a toothbrush and dental floss.   °· Using a fluoride toothpaste. A fluoride mouth rinse may also be used if recommended by your dentist or health care provider.   °· Restricting the amount of sugary and starchy foods and sugary liquids you consume.   °· Avoiding frequent snacking on these foods and sipping of these liquids.   °· Keeping regular visits with   a dentist for checkups and cleanings. PREVENTION   Practice good oral hygiene.  Consider a dental sealant. A dental sealant is a coating material that is applied by your dentist to the pits and grooves of teeth. The sealant prevents food from being trapped in them. It may protect the teeth for several years.  Ask about fluoride supplements if you live in a community without fluorinated water or with water that has a low fluoride content. Use fluoride supplements  as directed by your dentist or health care provider.  Allow fluoride varnish applications to teeth if directed by your dentist or health care provider. Document Released: 08/21/2002 Document Revised: 04/15/2014 Document Reviewed: 12/01/2012 [facility] Patient Information 2015 [place], [place]. This information is not intended to replace advice given to you by your health care provider. Make sure you discuss any questions you have with your health care provider.  Amoxicillin capsules or tablets What is this medicine? AMOXICILLIN (a mox i SIL in) is a penicillin antibiotic. It is used to treat certain kinds of bacterial infections. It will not work for colds, flu, or other viral infections. This medicine may be used for other purposes; ask your health care provider or pharmacist if you have questions. COMMON BRAND NAME(S): Amoxil, Moxilin, Sumox, Trimox What should I tell my health care provider before I take this medicine? They need to know if you have any of these conditions: -asthma -kidney disease -an unusual or allergic reaction to amoxicillin, other penicillins, cephalosporin antibiotics, other medicines, foods, dyes, or preservatives -pregnant or trying to get pregnant -breast-feeding How should I use this medicine? Take this medicine by mouth with a glass of water. Follow the directions on your prescription label. You may take this medicine with food or on an empty stomach. Take your medicine at regular intervals. Do not take your medicine more often than directed. Take all of your medicine as directed even if you think your are better. Do not skip doses or stop your medicine early. Talk to your pediatrician regarding the use of this medicine in children. While this drug may be prescribed for selected conditions, precautions do apply. Overdosage: If you think you have taken too much of this medicine contact a poison control center or emergency room at once. NOTE: This medicine is only for  you. Do not share this medicine with others. What if I miss a dose? If you miss a dose, take it as soon as you can. If it is almost time for your next dose, take only that dose. Do not take double or extra doses. What may interact with this medicine? -amiloride -birth control pills -chloramphenicol -macrolides -probenecid -sulfonamides -tetracyclines This list may not describe all possible interactions. Give your health care provider a list of all the medicines, herbs, non-prescription drugs, or dietary supplements you use. Also tell them if you smoke, drink alcohol, or use illegal drugs. Some items may interact with your medicine. What should I watch for while using this medicine? Tell your doctor or health care professional if your symptoms do not improve in 2 or 3 days. Take all of the doses of your medicine as directed. Do not skip doses or stop your medicine early. If you are diabetic, you may get a false positive result for sugar in your urine with certain brands of urine tests. Check with your doctor. Do not treat diarrhea with over-the-counter products. Contact your doctor if you have diarrhea that lasts more than 2 days or if the diarrhea is severe and  watery. °What side effects may I notice from receiving this medicine? °Side effects that you should report to your doctor or health care professional as soon as possible: °-allergic reactions like skin rash, itching or hives, swelling of the face, lips, or tongue °-breathing problems °-dark urine °-redness, blistering, peeling or loosening of the skin, including inside the mouth °-seizures °-severe or watery diarrhea °-trouble passing urine or change in the amount of urine °-unusual bleeding or bruising °-unusually weak or tired °-yellowing of the eyes or skin °Side effects that usually do not require medical attention (report to your doctor or health care professional if they continue or are bothersome): °-dizziness °-headache °-stomach  upset °-trouble sleeping °This list may not describe all possible side effects. Call your doctor for medical advice about side effects. You may report side effects to FDA at 1-800-FDA-1088. °Where should I keep my medicine? °Keep out of the reach of children. °Store between 68 and 77 degrees F (20 and 25 degrees C). Keep bottle closed tightly. Throw away any unused medicine after the expiration date. °NOTE: This sheet is a summary. It may not cover all possible information. If you have questions about this medicine, talk to your doctor, pharmacist, or health care provider. °© 2015, Elsevier/Gold Standard. (2008-02-20 14:10:59) ° °Acetaminophen; Oxycodone tablets °What is this medicine? °ACETAMINOPHEN; OXYCODONE (a set a MEE noe fen; ox i KOE done) is a pain reliever. It is used to treat mild to moderate pain. °This medicine may be used for other purposes; ask your health care provider or pharmacist if you have questions. °COMMON BRAND NAME(S): Endocet, Magnacet, Narvox, Percocet, Perloxx, Primalev, Primlev, Roxicet, Xolox °What should I tell my health care provider before I take this medicine? °They need to know if you have any of these conditions: °-brain tumor °-Crohn's disease, inflammatory bowel disease, or ulcerative colitis °-drug abuse or addiction °-head injury °-heart or circulation problems °-if you often drink alcohol °-kidney disease or problems going to the bathroom °-liver disease °-lung disease, asthma, or breathing problems °-an unusual or allergic reaction to acetaminophen, oxycodone, other opioid analgesics, other medicines, foods, dyes, or preservatives °-pregnant or trying to get pregnant °-breast-feeding °How should I use this medicine? °Take this medicine by mouth with a full glass of water. Follow the directions on the prescription label. Take your medicine at regular intervals. Do not take your medicine more often than directed. °Talk to your pediatrician regarding the use of this medicine in  children. Special care may be needed. °Patients over 65 years old may have a stronger reaction and need a smaller dose. °Overdosage: If you think you have taken too much of this medicine contact a poison control center or emergency room at once. °NOTE: This medicine is only for you. Do not share this medicine with others. °What if I miss a dose? °If you miss a dose, take it as soon as you can. If it is almost time for your next dose, take only that dose. Do not take double or extra doses. °What may interact with this medicine? °-alcohol °-antihistamines °-barbiturates like amobarbital, butalbital, butabarbital, methohexital, pentobarbital, phenobarbital, thiopental, and secobarbital °-benztropine °-drugs for bladder problems like solifenacin, trospium, oxybutynin, tolterodine, hyoscyamine, and methscopolamine °-drugs for breathing problems like ipratropium and tiotropium °-drugs for certain stomach or intestine problems like propantheline, homatropine methylbromide, glycopyrrolate, atropine, belladonna, and dicyclomine °-general anesthetics like etomidate, ketamine, nitrous oxide, propofol, desflurane, enflurane, halothane, isoflurane, and sevoflurane °-medicines for depression, anxiety, or psychotic disturbances °-medicines for sleep °-muscle relaxants °-naltrexone °-  narcotic medicines (opiates) for pain °-phenothiazines like perphenazine, thioridazine, chlorpromazine, mesoridazine, fluphenazine, prochlorperazine, promazine, and trifluoperazine °-scopolamine °-tramadol °-trihexyphenidyl °This list may not describe all possible interactions. Give your health care provider a list of all the medicines, herbs, non-prescription drugs, or dietary supplements you use. Also tell them if you smoke, drink alcohol, or use illegal drugs. Some items may interact with your medicine. °What should I watch for while using this medicine? °Tell your doctor or health care professional if your pain does not go away, if it gets worse,  or if you have new or a different type of pain. You may develop tolerance to the medicine. Tolerance means that you will need a higher dose of the medication for pain relief. Tolerance is normal and is expected if you take this medicine for a long time. °Do not suddenly stop taking your medicine because you may develop a severe reaction. Your body becomes used to the medicine. This does NOT mean you are addicted. Addiction is a behavior related to getting and using a drug for a non-medical reason. If you have pain, you have a medical reason to take pain medicine. Your doctor will tell you how much medicine to take. If your doctor wants you to stop the medicine, the dose will be slowly lowered over time to avoid any side effects. °You may get drowsy or dizzy. Do not drive, use machinery, or do anything that needs mental alertness until you know how this medicine affects you. Do not stand or sit up quickly, especially if you are an older patient. This reduces the risk of dizzy or fainting spells. Alcohol may interfere with the effect of this medicine. Avoid alcoholic drinks. °There are different types of narcotic medicines (opiates) for pain. If you take more than one type at the same time, you may have more side effects. Give your health care provider a list of all medicines you use. Your doctor will tell you how much medicine to take. Do not take more medicine than directed. Call emergency for help if you have problems breathing. °The medicine will cause constipation. Try to have a bowel movement at least every 2 to 3 days. If you do not have a bowel movement for 3 days, call your doctor or health care professional. °Do not take Tylenol (acetaminophen) or medicines that have acetaminophen with this medicine. Too much acetaminophen can be very dangerous. Many nonprescription medicines contain acetaminophen. Always read the labels carefully to avoid taking more acetaminophen. °What side effects may I notice from  receiving this medicine? °Side effects that you should report to your doctor or health care professional as soon as possible: °-allergic reactions like skin rash, itching or hives, swelling of the face, lips, or tongue °-breathing difficulties, wheezing °-confusion °-light headedness or fainting spells °-severe stomach pain °-unusually weak or tired °-yellowing of the skin or the whites of the eyes °Side effects that usually do not require medical attention (report to your doctor or health care professional if they continue or are bothersome): °-dizziness °-drowsiness °-nausea °-vomiting °This list may not describe all possible side effects. Call your doctor for medical advice about side effects. You may report side effects to FDA at 1-800-FDA-1088. °Where should I keep my medicine? °Keep out of the reach of children. This medicine can be abused. Keep your medicine in a safe place to protect it from theft. Do not share this medicine with anyone. Selling or giving away this medicine is dangerous and against the law. °Store at   room temperature between 20 and 25 degrees C (68 and 77 degrees F). Keep container tightly closed. Protect from light. This medicine may cause accidental overdose and death if it is taken by other adults, children, or pets. Flush any unused medicine down the toilet to reduce the chance of harm. Do not use the medicine after the expiration date. NOTE: This sheet is a summary. It may not cover all possible information. If you have questions about this medicine, talk to your doctor, pharmacist, or health care provider.  2015, Elsevier/Gold Standard. (2013-07-23 13:17:35)   Emergency Department Resource Guide 1) Find a Doctor and Pay Out of Pocket Although you won't have to find out who is covered by your insurance plan, it is a good idea to ask around and get recommendations. You will then need to call the office and see if the doctor you have chosen will accept you as a new patient and  what types of options they offer for patients who are self-pay. Some doctors offer discounts or will set up payment plans for their patients who do not have insurance, but you will need to ask so you aren't surprised when you get to your appointment.  2) Contact Your Local Health Department Not all health departments have doctors that can see patients for sick visits, but many do, so it is worth a call to see if yours does. If you don't know where your local health department is, you can check in your phone book. The CDC also has a tool to help you locate your state's health department, and many state websites also have listings of all of their local health departments.  3) Find a Walk-in Clinic If your illness is not likely to be very severe or complicated, you may want to try a walk in clinic. These are popping up all over the country in pharmacies, drugstores, and shopping centers. They're usually staffed by nurse practitioners or physician assistants that have been trained to treat common illnesses and complaints. They're usually fairly quick and inexpensive. However, if you have serious medical issues or chronic medical problems, these are probably not your best option.  No Primary Care Doctor: - Call [facility] Connect at  [phone] - they can help you locate a primary care doctor that  accepts your insurance, provides certain services, etc. - Physician Referral [phone]   Dental Care: Organization         Address  Phone  Notes  [facility] Department of [facility] [address] [place], [place] [phone] Accepts children up to age 48 who are enrolled in [place] or [place] Health Choice; pregnant women with a Medicaid card; and children who have applied for Medicaid or [place] Health Choice, but were declined, whose parents can pay a reduced fee at time of service.  [facility] Department of [facility]  [address], [place]  [phone] Accepts children up to age 73 who are enrolled in [place] or [place] Health Choice; pregnant women with a Medicaid card; and children who have applied for Medicaid or [place] Health Choice, but were declined, whose parents can pay a reduced fee at time of service.  [facility]  [address] [place], [place] [phone] Patients are seen by appointment only. Walk-ins are not accepted. [facility] will see patients 2 years of age and older. Monday - Tuesday (8am-5pm) Most Wednesdays (8:30-5pm) $30 per visit, cash only  [facility]  [address], [facility] [phone] Patients are seen by appointment only. Walk-ins are not accepted. [facility] will see patients 23 years of age and older. One Wednesday Evening (Monthly: Volunteer Based).  $30 per visit, cash only  [employer]  [phone] for adults; Children under age 35, call Graduate Pediatric Dentistry at [phone]. Children aged 78-14, please call [phone] to request a pediatric application.  Dental services are provided in all areas of dental care including fillings, crowns and bridges, complete and partial dentures, implants, gum treatment, root canals, and extractions. Preventive care is also provided. Treatment is provided to both adults and children. Patients are selected via a lottery and there is often a waiting list.   [facility] [address], [place]  [phone] [email or web address]   [facility] [address] [place], [place] [phone], Ext. [phone] Second and Fourth Thursday of each month, opens at 6:30 AM; Clinic ends at 9 AM.  Patients are seen on a first-come first-served basis, and a limited number are seen during each clinic.   [facility]  [address] [place], [place] [phone]   Eligibility Requirements You must have lived in [place], [place], or [place]  counties for at least the last three months.   You cannot be eligible for state or federal sponsored [place], including [employer], [place], or [employer].   You generally cannot be eligible for healthcare insurance through your employer.    How to apply: Eligibility screenings are held every Tuesday and Wednesday afternoon from 1:00 pm until 4:00 pm. You do not need an appointment for the interview!  [facility] [address] [place], [place] [phone]   [facility]  [phone]   [facility]  [phone]   [facility]  [phone]

## 2015-07-09 NOTE — ED Notes (Signed)
Pt c/o left side dental pain that became worse after his tooth broke while eating chicken yesterday,

## 2015-07-09 NOTE — ED Provider Notes (Signed)
CSN: [id number]     Arrival date & time 07/09/15  0515 History   First MD Initiated Contact with Patient 07/09/15 [phone]     Chief Complaint  Patient presents with  . Dental Pain     (Consider location/radiation/quality/duration/timing/severity/associated sxs/prior Treatment) Patient is a 29 y.o. male presenting with tooth pain. The history is provided by the patient.  Dental Pain He has had a toothache in a left lower molar and has an appointment to see a dentist in 3 weeks. Yesterday, he was chewing on some chicken and the tooth broke and his had severe pain since then. He has taken ibuprofen which has not given him any relief. He rates pain at 10/10.  History reviewed. No pertinent past medical history. History reviewed. No pertinent past surgical history. History reviewed. No pertinent family history. History  Substance Use Topics  . Smoking status: Current Every Day Smoker    Types: Cigarettes  . Smokeless tobacco: Not on file  . Alcohol Use: Yes    Review of Systems  All other systems reviewed and are negative.     Allergies  Review of patient's allergies indicates no known allergies.  Home Medications   Prior to Admission medications   Medication Sig Start Date End Date Taking? Authorizing Provider  ibuprofen (ADVIL,MOTRIN) 800 MG tablet Take 1 tablet (800 mg total) by mouth 3 (three) times daily. 10/15/14  Yes Tomasita Crumble, MD  HYDROcodone-acetaminophen (NORCO/VICODIN) 5-325 MG per tablet Take 1 tablet by mouth 3 (three) times daily as needed for severe pain. 10/15/14   Tomasita Crumble, MD  ondansetron (ZOFRAN ODT) 4 MG disintegrating tablet Take 2 tablets (8 mg total) by mouth every 8 (eight) hours as needed for nausea or vomiting. 03/21/14   Jennifer Piepenbrink, PA-C   BP 144/85 mmHg  Pulse 92  Temp(Src) 98 F (36.7 C)  Resp 18  Ht  (1.727 m)  Wt 198 lb (89.812 kg)  BMI 30.11 kg/m2  SpO2 98% Physical Exam  Nursing note and vitals reviewed.  29 year old male,  resting comfortably and in no acute distress. Vital signs are significant for borderline hypertension. Oxygen saturation is 98%, which is normal. Head is normocephalic and atraumatic. PERRLA, EOMI. Oropharynx is clear. Tooth #18 has a large carry on the occlusal and buccal surfaces. No underlying gingival erythema, pallor, swelling. Neck is nontender and supple without adenopathy or JVD. Back is nontender and there is no CVA tenderness. Lungs are clear without rales, wheezes, or rhonchi. Chest is nontender. Heart has regular rate and rhythm without murmur. Abdomen is soft, flat, nontender without masses or hepatosplenomegaly and peristalsis is normoactive. Extremities have no cyanosis or edema, full range of motion is present. Skin is warm and dry without rash. Neurologic: Mental status is normal, cranial nerves are intact, there are no motor or sensory deficits.  ED Course  Procedures (including critical care time)   MDM   Final diagnoses:  Dental caries    Dental pain secondary to very deep carry with possible fracture. Dental paste was applied to the tooth and is discharged with prescriptions for oxycodone have acetaminophen and amoxicillin. He is given dental resources to see if he can get in to be seen earlier than his established dentist appointment.    Dione Booze, MD 07/09/15 [phone]

## 2015-07-28 ENCOUNTER — Emergency Department (HOSPITAL_COMMUNITY)
Admission: EM | Admit: 2015-07-28 | Discharge: 2015-07-28 | Disposition: A | Discharge status: Home / Self Care | Payer: Self-pay | Attending: Emergency Medicine | Admitting: Emergency Medicine

## 2015-07-28 ENCOUNTER — Encounter (HOSPITAL_COMMUNITY): Payer: Self-pay | Admitting: Emergency Medicine

## 2015-07-28 DIAGNOSIS — K029 Dental caries, unspecified: Secondary | ICD-10-CM | POA: Insufficient documentation

## 2015-07-28 DIAGNOSIS — Z791 Long term (current) use of non-steroidal anti-inflammatories (NSAID): Secondary | ICD-10-CM | POA: Insufficient documentation

## 2015-07-28 DIAGNOSIS — K088 Other specified disorders of teeth and supporting structures: Secondary | ICD-10-CM | POA: Insufficient documentation

## 2015-07-28 DIAGNOSIS — Z720 Tobacco use: Secondary | ICD-10-CM | POA: Insufficient documentation

## 2015-07-28 MED ORDER — HYDROCODONE-ACETAMINOPHEN 5-325 MG PO TABS: 1.0000 | ORAL_TABLET | ORAL | Status: DC | PRN

## 2015-07-28 MED ORDER — AMOXICILLIN 500 MG PO CAPS: 500.0000 mg | ORAL_CAPSULE | Freq: Three times a day (TID) | ORAL | Status: DC

## 2015-07-28 NOTE — Discharge Instructions (Signed)
Dental Caries °Dental caries is tooth decay. This decay can cause a hole in teeth (cavity) that can get bigger and deeper over time. °HOME CARE °· Brush and floss your teeth. Do this at least two times a day. °· Use a fluoride toothpaste. °· Use a mouth rinse if told by your dentist or doctor. °· Eat less sugary and starchy foods. Drink less sugary drinks. °· Avoid snacking often on sugary and starchy foods. Avoid sipping often on sugary drinks. °· Keep regular checkups and cleanings with your dentist. °· Use fluoride supplements if told by your dentist or doctor. °· Allow fluoride to be applied to teeth if told by your dentist or doctor. °Document Released: 09/07/2008 Document Revised: 04/15/2014 Document Reviewed: 12/01/2012 °ExitCare® Patient Information ©2015 [employer]. This information is not intended to replace advice given to you by your health care provider. Make sure you discuss any questions you have with your health care provider. ° °Dental Pain °A tooth ache may be caused by cavities (tooth decay). Cavities expose the nerve of the tooth to air and hot or cold temperatures. It may come from an infection or abscess (also called a boil or furuncle) around your tooth. It is also often caused by dental caries (tooth decay). This causes the pain you are having. °DIAGNOSIS  °Your caregiver can diagnose this problem by exam. °TREATMENT  °· If caused by an infection, it may be treated with medications which kill germs (antibiotics) and pain medications as prescribed by your caregiver. Take medications as directed. °· Only take over-the-counter or prescription medicines for pain, discomfort, or fever as directed by your caregiver. °· Whether the tooth ache today is caused by infection or dental disease, you should see your dentist as soon as possible for further care. °SEEK MEDICAL CARE IF: °The exam and treatment you received today has been provided on an emergency basis only. This is not a substitute for  complete medical or dental care. If your problem worsens or new problems (symptoms) appear, and you are unable to meet with your dentist, call or return to this location. °SEEK IMMEDIATE MEDICAL CARE IF:  °· You have a fever. °· You develop redness and swelling of your face, jaw, or neck. °· You are unable to open your mouth. °· You have severe pain uncontrolled by pain medicine. °MAKE SURE YOU:  °· Understand these instructions. °· Will watch your condition. °· Will get help right away if you are not doing well or get worse. °Document Released: 11/29/2005 Document Revised: 02/21/2012 Document Reviewed: 07/17/2008 °ExitCare® Patient Information ©2015 [employer]. This information is not intended to replace advice given to you by your health care provider. Make sure you discuss any questions you have with your health care provider. ° °

## 2015-07-28 NOTE — ED Notes (Addendum)
Pt c/o breaking left bottom tooth while eating today. States he had molding placed in same tooth x 1 month ago and has dental appointment in 2 weeks.

## 2015-07-28 NOTE — ED Provider Notes (Signed)
CSN: [id number]     Arrival date & time 07/28/15  1547 History  This chart was scribed for non-physician practitioner Kerrie Buffalo, NP, working with Nelva Nay, MD by Littie Deeds, ED Scribe. This patient was seen in room APFT23/APFT23 and the patient's care was started at 4:20 PM.      Chief Complaint  Patient presents with  . Dental Pain   Patient is a 29 y.o. male presenting with tooth pain. The history is provided by the patient. No language interpreter was used.  Dental Pain Location:  Lower Lower teeth location:  18/LL 2nd molar Quality:  Shooting Duration:  1 month Ineffective treatments:  NSAIDs  HPI Comments: Larry Best is a 29 y.o. male who presents to the Emergency Department complaining of shooting left lower dental pain radiating to his the left side of his head that started about a month ago, but worsened today around 1:00 PM while eating at [employer]. Patient states the pain worsened after he bit into a piece of chicken cartilage. He was seen here about 3 weeks ago and had some molding placed then, which did provide relief. He was put on antibiotics at that time and he has since finished the course of antibiotics. Patient has tried ibuprofen but without relief; he last took ibuprofen around 1:00 PM today. He denies chest pain and abdominal pain. He has a dental appointment in 2 weeks and plans to have the tooth extracted.    History reviewed. No pertinent past medical history. History reviewed. No pertinent past surgical history. No family history on file. Social History  Substance Use Topics  . Smoking status: Current Every Day Smoker -- 0.50 packs/day    Types: Cigarettes  . Smokeless tobacco: None  . Alcohol Use: Yes    Review of Systems  HENT: Positive for dental problem.   Cardiovascular: Negative for chest pain.  Gastrointestinal: Negative for abdominal pain.  all other systems negative    Allergies  Review of patient's allergies indicates no known  allergies.  Home Medications   Prior to Admission medications   Medication Sig Start Date End Date Taking? Authorizing Provider  amoxicillin (AMOXIL) 500 MG capsule Take 1 capsule (500 mg total) by mouth 3 (three) times daily. 07/28/15   Menashe Kafer Orlene Och, NP  HYDROcodone-acetaminophen (NORCO/VICODIN) 5-325 MG per tablet Take 1 tablet by mouth every 4 (four) hours as needed. 07/28/15   Ralynn San Orlene Och, NP  ibuprofen (ADVIL,MOTRIN) 800 MG tablet Take 1 tablet (800 mg total) by mouth 3 (three) times daily. 10/15/14   Tomasita Crumble, MD  ondansetron (ZOFRAN ODT) 4 MG disintegrating tablet Take 2 tablets (8 mg total) by mouth every 8 (eight) hours as needed for nausea or vomiting. 03/21/14   Jennifer Piepenbrink, PA-C   BP 133/66 mmHg  Pulse 81  Temp(Src) 98.3 F (36.8 C) (Oral)  Resp 18  Ht 5\' 9"  (1.753 m)  Wt 250 lb (113.399 kg)  BMI 36.90 kg/m2  SpO2 99% Physical Exam  Constitutional: He is oriented to person, place, and time. He appears well-developed and well-nourished. No distress.  HENT:  Mouth/Throat: Uvula is midline. No posterior oropharyngeal edema or posterior oropharyngeal erythema.    TMs clear, light reflex present. Left lower second molar is decayed to the gumline and broken. Swelling of the surrounding gums. Upper second molar is decayed.  Eyes: EOM are normal. Pupils are equal, round, and reactive to light. No scleral icterus.  Sclera clear.  Neck: Neck supple.  Anterior cervical nodes  palpable.  Cardiovascular: Normal rate.   Pulmonary/Chest: Effort normal.  Abdominal: Soft. There is no tenderness.  Musculoskeletal: Normal range of motion.  Lymphadenopathy:    He has cervical adenopathy.  Neurological: He is alert and oriented to person, place, and time. No cranial nerve deficit.  Skin: Skin is warm and dry.  Psychiatric: He has a normal mood and affect. His behavior is normal.  Nursing note and vitals reviewed.   ED Course  Procedures  Applied topical anesthetic to the  lower left second molar then applied, dental paste applied as was done on previous visit. Patient reports decreased pain.   DIAGNOSTIC STUDIES: Oxygen Saturation is 99% on room air, normal by my interpretation.    COORDINATION OF CARE: 4:24 PM-Discussed treatment plan which includes dental paste with patient/guardian at bedside and patient/guardian agreed to plan. Patient drove himself here today.   MDM  29 y.o. male with broken tooth and dental caries causing pain. Stable for d/c without fever or facial swelling. He will keep his appointment with the dentist this month and take the antibiotics as directed. Discussed with the patient and all questioned fully answered. He will return if any problems arise.   Final diagnoses:  Pain due to dental caries    I personally performed the services described in this documentation, which was scribed in my presence. The recorded information has been reviewed and is accurate.   [address] [place], [place] 07/28/15 1657  Nelva Nay, MD 07/31/15 [phone]

## 2016-11-28 ENCOUNTER — Emergency Department (HOSPITAL_COMMUNITY)
Admission: EM | Admit: 2016-11-28 | Discharge: 2016-11-28 | Disposition: A | Discharge status: Home / Self Care | Payer: Self-pay | Attending: Emergency Medicine | Admitting: Emergency Medicine

## 2016-11-28 ENCOUNTER — Emergency Department (HOSPITAL_COMMUNITY): Payer: Self-pay

## 2016-11-28 ENCOUNTER — Encounter (HOSPITAL_COMMUNITY): Payer: Self-pay | Admitting: Emergency Medicine

## 2016-11-28 DIAGNOSIS — M5432 Sciatica, left side: Secondary | ICD-10-CM | POA: Insufficient documentation

## 2016-11-28 DIAGNOSIS — M5442 Lumbago with sciatica, left side: Secondary | ICD-10-CM

## 2016-11-28 DIAGNOSIS — F17210 Nicotine dependence, cigarettes, uncomplicated: Secondary | ICD-10-CM | POA: Insufficient documentation

## 2016-11-28 LAB — I-STAT CHEM 8, ED
BUN: 8 mg/dL (ref 6–20)
CHLORIDE: 103 mmol/L (ref 101–111)
Calcium, Ion: 1.1 mmol/L — ABNORMAL LOW (ref 1.15–1.40)
Creatinine, Ser: 0.9 mg/dL (ref 0.61–1.24)
Glucose, Bld: 94 mg/dL (ref 65–99)
HCT: 43 % (ref 39.0–52.0)
HEMOGLOBIN: 14.6 g/dL (ref 13.0–17.0)
POTASSIUM: 4.6 mmol/L (ref 3.5–5.1)
SODIUM: 137 mmol/L (ref 135–145)
TCO2: 25 mmol/L (ref 0–100)

## 2016-11-28 MED ORDER — HYDROMORPHONE HCL 2 MG/ML IJ SOLN
1.0000 mg | Freq: Once | INTRAMUSCULAR | Status: AC
  Administered 2016-11-28: 1 mg via INTRAVENOUS
  Filled 2016-11-28: qty 1

## 2016-11-28 MED ORDER — CYCLOBENZAPRINE HCL 10 MG PO TABS: 10.0000 mg | ORAL_TABLET | Freq: Two times a day (BID) | ORAL | 0 refills | Status: DC | PRN

## 2016-11-28 MED ORDER — KETOROLAC TROMETHAMINE 30 MG/ML IJ SOLN
30.0000 mg | Freq: Once | INTRAMUSCULAR | Status: AC
  Administered 2016-11-28: 30 mg via INTRAVENOUS
  Filled 2016-11-28: qty 1

## 2016-11-28 MED ORDER — HYDROCODONE-ACETAMINOPHEN 5-325 MG PO TABS: 2.0000 | ORAL_TABLET | ORAL | 0 refills | Status: DC | PRN

## 2016-11-28 MED ORDER — IBUPROFEN 800 MG PO TABS: 800.0000 mg | ORAL_TABLET | Freq: Three times a day (TID) | ORAL | 0 refills | Status: DC

## 2016-11-28 MED ORDER — LORAZEPAM 2 MG/ML IJ SOLN
1.0000 mg | Freq: Once | INTRAMUSCULAR | Status: AC
  Administered 2016-11-28: 1 mg via INTRAVENOUS
  Filled 2016-11-28: qty 1

## 2016-11-28 MED ORDER — HYDROMORPHONE HCL 2 MG/ML IJ SOLN: 1.0000 mg | Freq: Once | INTRAMUSCULAR | Status: DC

## 2016-11-28 MED ORDER — DEXAMETHASONE SODIUM PHOSPHATE 10 MG/ML IJ SOLN
10.0000 mg | Freq: Once | INTRAMUSCULAR | Status: AC
  Administered 2016-11-28: 10 mg via INTRAVENOUS
  Filled 2016-11-28: qty 1

## 2016-11-28 NOTE — ED Notes (Signed)
Pt states they cannot leave b/c the pain is too great.  MD notified.

## 2016-11-28 NOTE — Discharge Instructions (Signed)
Thank you for visiting ED today. Please make an appointment with orthopedic as soon as possible. Please take your medicine as directed. I'm giving you a small prescription of stronger pain medicine called Norco, just take it if your pain is not controlled after ibuprofen. I'm also providing you a resource guide, you can use those facilities if needed before your insurance kicks in.

## 2016-11-28 NOTE — ED Notes (Signed)
Patient transported to X-ray 

## 2016-11-28 NOTE — ED Provider Notes (Signed)
Emergency Department Provider Note   I have reviewed the triage vital signs and the nursing notes.   HISTORY  Chief Complaint Back Pain   HPI Larry Best is a 30 y.o. male with no significant past medical history came to the ED with complaint of worsening lower back pain since yesterday. He states that he fell in shower last night and hurt his back, he also told me that he always have mild chronic lower back pain but never that severe. Pain is 10 over 10 in intensity, radiating to left leg, associated with some nausea without any vomiting. He denies any tingling numbness, urinary or fecal incontinence or any focal lower extremity weakness. He states he was unable to sleep last night because of pain. Any body movement aggravate the pain, no alleviating factor.  History reviewed. No pertinent past medical history.  There are no active problems to display for this patient.   History reviewed. No pertinent surgical history.  Current Outpatient Rx  . Order #: [id number] Class: Print  . Order #: [id number] Class: Print  . Order #: [id number] Class: Print    Allergies Patient has no known allergies.  History reviewed. No pertinent family history.  Social History Social History  Substance Use Topics  . Smoking status: Current Every Day Smoker    Packs/day: 0.50    Types: Cigarettes  . Smokeless tobacco: Not on file  . Alcohol use Yes    Review of Systems Constitutional: No fever/chills Eyes: No visual changes. ENT: No sore throat. Cardiovascular: Denies chest pain. Respiratory: Denies shortness of breath. Gastrointestinal: No abdominal pain.   nausea, no vomiting.  No diarrhea.  No constipation. Genitourinary: Negative for dysuria. Musculoskeletal:  back pain Radiating to left leg. Neurological: Negative for headaches, focal weakness or numbness. 10-point ROS otherwise negative.  ____________________________________________   PHYSICAL EXAM:  VITAL SIGNS: ED  Triage Vitals  Enc Vitals Group     BP 11/28/16 0902 121/70     Pulse Rate 11/28/16 0902 95     Resp 11/28/16 0902 25     Temp 11/28/16 0902 98.3 F (36.8 C)     Temp Source 11/28/16 0902 Oral     SpO2 11/28/16 0902 98 %     Weight --      Height --      Head Circumference --      Peak Flow --      Pain Score 11/28/16 0903 10     Pain Loc --      Pain Edu? --      Excl. in GC? --    Constitutional: Alert and oriented. In mild distress because of pain. Eyes: Conjunctivae are normal. PERRL. EOMI. Head: Atraumatic. Nose: No congestion/rhinnorhea. Mouth/Throat: Mucous membranes are moist.  Oropharynx non-erythematous. Neck: No stridor.  No meningeal signs. No cervical spine tenderness to palpation. Cardiovascular: Normal rate, regular rhythm. Good peripheral circulation. Grossly normal heart sounds.   Respiratory: Normal respiratory effort.  No retractions. Lungs CTAB. Gastrointestinal: Soft and nontender. No distention.  Musculoskeletal: Tenderness along the lumbar region and left paraspinal area , had a skin abrasion and erythema starting from midline to left lateral lumbar region, No lower extremity tenderness nor edema. No gross deformities of extremities. Neurologic:  Normal speech and language. No gross focal neurologic deficits are appreciated.   ____________________________________________   LABS (all labs ordered are listed, but only abnormal results are displayed)  Labs Reviewed  I-STAT CHEM 8, ED - Abnormal; Notable for the following:  Result Value   Calcium, Ion 1.10 (*)    All other components within normal limits   ____________________________________________  EKG  None ____________________________________________  RADIOLOGY  Dg Lumbar Spine Complete  Result Date: 11/28/2016 CLINICAL DATA:  Fall 2 days ago, low back pain, left leg pain EXAM: LUMBAR SPINE - COMPLETE 4+ VIEW COMPARISON:  None. FINDINGS: Five views of the lumbar spine submitted. No  acute fracture or subluxation. Alignment and vertebral body heights are preserved. Mild disc space flattening at L5-S1 level. There is mild disc space flattening with anterior spurring at T11-T12 level. IMPRESSION: No acute fracture or subluxation. Mild disc space flattening at L5-S1 level. Mild disc space flattening with anterior spurring at T11-T12 level. Electronically Signed   By: Natasha MeadLiviu  Pop M.D.   On: 11/28/2016 10:56    ____________________________________________   PROCEDURES  Procedure(s) performed:   Procedures   ____________________________________________   INITIAL IMPRESSION / ASSESSMENT AND PLAN / ED COURSE  Pertinent labs & imaging results that were available during my care of the patient were reviewed by me and considered in my medical decision making (see chart for details).  Larry Best is a 30 y.o. male with no significant past medical history came to the ED with complaint of worsening lower back pain since yesterday. He states that he fell in shower last night and hurt his back, he also told me that he always have mild chronic lower back pain but never that severe. Pain is 10 over 10 in intensity, radiating to left leg, associated with some nausea without any vomiting. He denies any tingling numbness, urinary or fecal incontinence or any focal lower extremity weakness.   Patient seems in pain, and was not very cooperative with neurologic exam because of pain. We gave him a dose of Dilaudid 1 mg daily, Toradol 30 mg and Ativan and will do x-ray.  His lumbar x-ray was negative for any acute fracture, shows mild disc space flattening and and the spurring at T 12 and L5 and S1.  He is being discharged home as he is being advised to contact an orthopedic for has chronic back pain.   ____________________________________________  FINAL CLINICAL IMPRESSION(S) / ED DIAGNOSES  Final diagnoses:  Acute bilateral low back pain with left-sided sciatica      MEDICATIONS GIVEN DURING THIS VISIT:  Medications  HYDROmorphone (DILAUDID) injection 1 mg (1 mg Intravenous Given 11/28/16 0945)  ketorolac (TORADOL) 30 MG/ML injection 30 mg (30 mg Intravenous Given 11/28/16 1030)  LORazepam (ATIVAN) injection 1 mg (1 mg Intravenous Given 11/28/16 1031)  HYDROmorphone (DILAUDID) injection 1 mg (1 mg Intravenous Given 11/28/16 1109)  dexamethasone (DECADRON) injection 10 mg (10 mg Intravenous Given 11/28/16 1109)     NEW OUTPATIENT MEDICATIONS STARTED DURING THIS VISIT:  New Prescriptions   CYCLOBENZAPRINE (FLEXERIL) 10 MG TABLET    Take 1 tablet (10 mg total) by mouth 2 (two) times daily as needed for muscle spasms.   HYDROCODONE-ACETAMINOPHEN (NORCO/VICODIN) 5-325 MG TABLET    Take 2 tablets by mouth every 4 (four) hours as needed.   IBUPROFEN (ADVIL,MOTRIN) 800 MG TABLET    Take 1 tablet (800 mg total) by mouth 3 (three) times daily.      Note:  This document was prepared using Dragon voice recognition software and may include unintentional dictation errors.   Emergency Medicine   Arnetha CourserSumayya Trenia Tennyson, MD 11/28/16 1152    Rolan BuccoMelanie Belfi, MD 11/28/16 [phone]

## 2016-11-28 NOTE — ED Triage Notes (Signed)
Pt sts lower back pain with radiation to left leg that is chronic in nature worse x last 2 days

## 2016-12-03 ENCOUNTER — Inpatient Hospital Stay (HOSPITAL_COMMUNITY)
Admission: EM | Admit: 2016-12-03 | Discharge: 2016-12-10 | DRG: 029 | Disposition: A | Discharge status: Home / Self Care | Payer: Self-pay | Attending: Internal Medicine | Admitting: Internal Medicine

## 2016-12-03 ENCOUNTER — Encounter (HOSPITAL_COMMUNITY): Payer: Self-pay

## 2016-12-03 ENCOUNTER — Ambulatory Visit
Admission: RE | Admit: 2016-12-03 | Discharge: 2016-12-03 | Disposition: A | Discharge status: Home / Self Care | Payer: No Typology Code available for payment source | Source: Ambulatory Visit | Attending: Family Medicine | Admitting: Family Medicine

## 2016-12-03 ENCOUNTER — Other Ambulatory Visit: Payer: Self-pay | Admitting: Family Medicine

## 2016-12-03 ENCOUNTER — Inpatient Hospital Stay (HOSPITAL_COMMUNITY): Payer: Self-pay

## 2016-12-03 ENCOUNTER — Encounter (HOSPITAL_COMMUNITY)
Admission: EM | Disposition: A | Discharge status: Home / Self Care | Payer: Self-pay | Source: Home / Self Care | Attending: Internal Medicine

## 2016-12-03 ENCOUNTER — Inpatient Hospital Stay (HOSPITAL_COMMUNITY): Payer: Self-pay | Admitting: Anesthesiology

## 2016-12-03 DIAGNOSIS — M545 Low back pain, unspecified: Secondary | ICD-10-CM

## 2016-12-03 DIAGNOSIS — A4901 Methicillin susceptible Staphylococcus aureus infection, unspecified site: Secondary | ICD-10-CM

## 2016-12-03 DIAGNOSIS — R200 Anesthesia of skin: Secondary | ICD-10-CM

## 2016-12-03 DIAGNOSIS — M60009 Infective myositis, unspecified site: Secondary | ICD-10-CM

## 2016-12-03 DIAGNOSIS — F17210 Nicotine dependence, cigarettes, uncomplicated: Secondary | ICD-10-CM | POA: Diagnosis present

## 2016-12-03 DIAGNOSIS — R7881 Bacteremia: Secondary | ICD-10-CM | POA: Diagnosis present

## 2016-12-03 DIAGNOSIS — K5900 Constipation, unspecified: Secondary | ICD-10-CM | POA: Diagnosis not present

## 2016-12-03 DIAGNOSIS — R51 Headache: Secondary | ICD-10-CM | POA: Diagnosis not present

## 2016-12-03 DIAGNOSIS — F1990 Other psychoactive substance use, unspecified, uncomplicated: Secondary | ICD-10-CM

## 2016-12-03 DIAGNOSIS — M6008 Infective myositis, other site: Secondary | ICD-10-CM | POA: Diagnosis present

## 2016-12-03 DIAGNOSIS — B9561 Methicillin susceptible Staphylococcus aureus infection as the cause of diseases classified elsewhere: Secondary | ICD-10-CM | POA: Diagnosis present

## 2016-12-03 DIAGNOSIS — G062 Extradural and subdural abscess, unspecified: Secondary | ICD-10-CM | POA: Diagnosis present

## 2016-12-03 DIAGNOSIS — Z419 Encounter for procedure for purposes other than remedying health state, unspecified: Secondary | ICD-10-CM

## 2016-12-03 DIAGNOSIS — M4626 Osteomyelitis of vertebra, lumbar region: Secondary | ICD-10-CM | POA: Diagnosis present

## 2016-12-03 DIAGNOSIS — R3911 Hesitancy of micturition: Secondary | ICD-10-CM | POA: Diagnosis not present

## 2016-12-03 DIAGNOSIS — D649 Anemia, unspecified: Secondary | ICD-10-CM | POA: Diagnosis present

## 2016-12-03 DIAGNOSIS — R519 Headache, unspecified: Secondary | ICD-10-CM

## 2016-12-03 DIAGNOSIS — W182XXA Fall in (into) shower or empty bathtub, initial encounter: Secondary | ICD-10-CM | POA: Diagnosis present

## 2016-12-03 DIAGNOSIS — G061 Intraspinal abscess and granuloma: Principal | ICD-10-CM | POA: Diagnosis present

## 2016-12-03 DIAGNOSIS — M4646 Discitis, unspecified, lumbar region: Secondary | ICD-10-CM | POA: Diagnosis present

## 2016-12-03 HISTORY — DX: Methicillin susceptible Staphylococcus aureus infection, unspecified site: A49.01

## 2016-12-03 HISTORY — DX: Other psychoactive substance use, unspecified, uncomplicated: F19.90

## 2016-12-03 HISTORY — DX: Bacteremia: R78.81

## 2016-12-03 HISTORY — PX: LUMBAR LAMINECTOMY FOR EPIDURAL ABSCESS: SHX5956

## 2016-12-03 LAB — CBC WITH DIFFERENTIAL/PLATELET
BASOS ABS: 0.1 10*3/uL (ref 0.0–0.1)
BASOS PCT: 0 %
EOS PCT: 1 %
Eosinophils Absolute: 0.3 10*3/uL (ref 0.0–0.7)
HCT: 35.6 % — ABNORMAL LOW (ref 39.0–52.0)
Hemoglobin: 11.8 g/dL — ABNORMAL LOW (ref 13.0–17.0)
LYMPHS PCT: 12 %
Lymphs Abs: 2.2 10*3/uL (ref 0.7–4.0)
MCH: 29.4 pg (ref 26.0–34.0)
MCHC: 33.1 g/dL (ref 30.0–36.0)
MCV: 88.6 fL (ref 78.0–100.0)
Monocytes Absolute: 2.2 10*3/uL — ABNORMAL HIGH (ref 0.1–1.0)
Monocytes Relative: 12 %
NEUTROS ABS: 13.8 10*3/uL — AB (ref 1.7–7.7)
Neutrophils Relative %: 75 %
PLATELETS: 229 10*3/uL (ref 150–400)
RBC: 4.02 MIL/uL — AB (ref 4.22–5.81)
RDW: 13.8 % (ref 11.5–15.5)
WBC: 18.5 10*3/uL — AB (ref 4.0–10.5)

## 2016-12-03 LAB — C-REACTIVE PROTEIN: CRP: 27.4 mg/dL — AB (ref <1.0)

## 2016-12-03 LAB — PROTIME-INR
INR: 0.98
PROTHROMBIN TIME: 13 s (ref 11.4–15.2)

## 2016-12-03 LAB — BASIC METABOLIC PANEL
ANION GAP: 9 (ref 5–15)
BUN: 9 mg/dL (ref 6–20)
CALCIUM: 8.2 mg/dL — AB (ref 8.9–10.3)
CO2: 25 mmol/L (ref 22–32)
Chloride: 102 mmol/L (ref 101–111)
Creatinine, Ser: 0.76 mg/dL (ref 0.61–1.24)
GLUCOSE: 91 mg/dL (ref 65–99)
POTASSIUM: 3.5 mmol/L (ref 3.5–5.1)
Sodium: 136 mmol/L (ref 135–145)

## 2016-12-03 LAB — TYPE AND SCREEN
ABO/RH(D): A POS
Antibody Screen: NEGATIVE

## 2016-12-03 LAB — APTT: aPTT: 37 seconds — ABNORMAL HIGH (ref 24–36)

## 2016-12-03 LAB — ABO/RH: ABO/RH(D): A POS

## 2016-12-03 LAB — SEDIMENTATION RATE: SED RATE: 56 mm/h — AB (ref 0–16)

## 2016-12-03 LAB — I-STAT CG4 LACTIC ACID, ED: LACTIC ACID, VENOUS: 1.18 mmol/L (ref 0.5–1.9)

## 2016-12-03 SURGERY — LUMBAR LAMINECTOMY FOR EPIDURAL ABSCESS
Anesthesia: General | Site: Back

## 2016-12-03 MED ORDER — CYCLOBENZAPRINE HCL 10 MG PO TABS
10.0000 mg | ORAL_TABLET | Freq: Three times a day (TID) | ORAL | Status: DC | PRN
  Administered 2016-12-04: 10 mg via ORAL
  Filled 2016-12-03: qty 1

## 2016-12-03 MED ORDER — MIDAZOLAM HCL 5 MG/5ML IJ SOLN
INTRAMUSCULAR | Status: DC | PRN
  Administered 2016-12-03: 2 mg via INTRAVENOUS

## 2016-12-03 MED ORDER — VANCOMYCIN HCL IN DEXTROSE 1-5 GM/200ML-% IV SOLN
INTRAVENOUS | Status: AC
Start: 2016-12-03 — End: 2016-12-03
  Filled 2016-12-03: qty 200

## 2016-12-03 MED ORDER — SODIUM CHLORIDE 0.9 % IV SOLN
250.0000 mL | INTRAVENOUS | Status: DC
  Administered 2016-12-07: 250 mL via INTRAVENOUS

## 2016-12-03 MED ORDER — THROMBIN 5000 UNITS EX SOLR
CUTANEOUS | Status: AC
  Filled 2016-12-03: qty 10000

## 2016-12-03 MED ORDER — LIDOCAINE-EPINEPHRINE (PF) 2 %-1:200000 IJ SOLN
INTRAMUSCULAR | Status: AC
  Filled 2016-12-03: qty 20

## 2016-12-03 MED ORDER — 0.9 % SODIUM CHLORIDE (POUR BTL) OPTIME
TOPICAL | Status: DC | PRN
  Administered 2016-12-03: 1000 mL

## 2016-12-03 MED ORDER — ARTIFICIAL TEARS OP OINT
TOPICAL_OINTMENT | OPHTHALMIC | Status: DC | PRN
  Administered 2016-12-03: 1 via OPHTHALMIC

## 2016-12-03 MED ORDER — LIDOCAINE 2% (20 MG/ML) 5 ML SYRINGE
INTRAMUSCULAR | Status: AC
  Filled 2016-12-03: qty 5

## 2016-12-03 MED ORDER — SODIUM CHLORIDE 0.9 % IV SOLN
Freq: Once | INTRAVENOUS | Status: AC
  Administered 2016-12-03: 17:00:00 via INTRAVENOUS

## 2016-12-03 MED ORDER — CEFAZOLIN SODIUM-DEXTROSE 2-4 GM/100ML-% IV SOLN
2.0000 g | Freq: Three times a day (TID) | INTRAVENOUS | Status: AC
  Administered 2016-12-04 (×2): 2 g via INTRAVENOUS
  Filled 2016-12-03 (×2): qty 100

## 2016-12-03 MED ORDER — HEMOSTATIC AGENTS (NO CHARGE) OPTIME
TOPICAL | Status: DC | PRN
  Administered 2016-12-03: 1 via TOPICAL

## 2016-12-03 MED ORDER — KETOROLAC TROMETHAMINE 30 MG/ML IJ SOLN
30.0000 mg | Freq: Once | INTRAMUSCULAR | Status: AC
  Administered 2016-12-03: 30 mg via INTRAVENOUS
  Filled 2016-12-03: qty 1

## 2016-12-03 MED ORDER — OXYCODONE HCL 5 MG PO TABS
15.0000 mg | ORAL_TABLET | ORAL | Status: DC | PRN
  Administered 2016-12-04: 15 mg via ORAL
  Filled 2016-12-03: qty 3

## 2016-12-03 MED ORDER — FENTANYL CITRATE (PF) 100 MCG/2ML IJ SOLN
INTRAMUSCULAR | Status: AC
  Filled 2016-12-03: qty 2

## 2016-12-03 MED ORDER — ACETAMINOPHEN 650 MG RE SUPP: 650.0000 mg | Freq: Four times a day (QID) | RECTAL | Status: DC | PRN

## 2016-12-03 MED ORDER — DEXTROSE 5 % IV SOLN
INTRAVENOUS | Status: DC | PRN
  Administered 2016-12-03 (×2): via INTRAVENOUS

## 2016-12-03 MED ORDER — MENTHOL 3 MG MT LOZG: 1.0000 | LOZENGE | OROMUCOSAL | Status: DC | PRN

## 2016-12-03 MED ORDER — ACETAMINOPHEN 325 MG PO TABS: 650.0000 mg | ORAL_TABLET | Freq: Four times a day (QID) | ORAL | Status: DC | PRN

## 2016-12-03 MED ORDER — ONDANSETRON HCL 4 MG/2ML IJ SOLN
4.0000 mg | INTRAMUSCULAR | Status: DC | PRN
Start: 2016-12-03 — End: 2016-12-10

## 2016-12-03 MED ORDER — VECURONIUM BROMIDE 10 MG IV SOLR
INTRAVENOUS | Status: DC | PRN
  Administered 2016-12-03: 4 mg via INTRAVENOUS
  Administered 2016-12-03: 2 mg via INTRAVENOUS

## 2016-12-03 MED ORDER — SODIUM CHLORIDE 0.9% FLUSH: 3.0000 mL | INTRAVENOUS | Status: DC | PRN

## 2016-12-03 MED ORDER — SUGAMMADEX SODIUM 200 MG/2ML IV SOLN
INTRAVENOUS | Status: AC
  Filled 2016-12-03: qty 2

## 2016-12-03 MED ORDER — HYDROMORPHONE HCL 2 MG/ML IJ SOLN
INTRAMUSCULAR | Status: AC
  Filled 2016-12-03: qty 1

## 2016-12-03 MED ORDER — SODIUM CHLORIDE 0.9 % IR SOLN
Status: DC | PRN
  Administered 2016-12-03: 19:00:00

## 2016-12-03 MED ORDER — ONDANSETRON HCL 4 MG/2ML IJ SOLN
INTRAMUSCULAR | Status: DC | PRN
  Administered 2016-12-03: 4 mg via INTRAVENOUS

## 2016-12-03 MED ORDER — PHENOL 1.4 % MT LIQD: 1.0000 | OROMUCOSAL | Status: DC | PRN

## 2016-12-03 MED ORDER — THROMBIN 5000 UNITS EX SOLR
CUTANEOUS | Status: DC | PRN
  Administered 2016-12-03 (×2): 5000 [IU] via TOPICAL

## 2016-12-03 MED ORDER — HYDROMORPHONE HCL 1 MG/ML IJ SOLN
0.2500 mg | INTRAMUSCULAR | Status: DC | PRN
  Administered 2016-12-03 (×4): 0.5 mg via INTRAVENOUS

## 2016-12-03 MED ORDER — OXYCODONE-ACETAMINOPHEN 5-325 MG PO TABS: 1.0000 | ORAL_TABLET | ORAL | Status: DC | PRN

## 2016-12-03 MED ORDER — BUPIVACAINE HCL (PF) 0.25 % IJ SOLN
INTRAMUSCULAR | Status: AC
  Filled 2016-12-03: qty 30

## 2016-12-03 MED ORDER — HYDROMORPHONE HCL 2 MG/ML IJ SOLN
1.0000 mg | Freq: Once | INTRAMUSCULAR | Status: AC
  Administered 2016-12-03: 1 mg via INTRAVENOUS
  Filled 2016-12-03: qty 1

## 2016-12-03 MED ORDER — ACETAMINOPHEN 650 MG RE SUPP: 650.0000 mg | RECTAL | Status: DC | PRN

## 2016-12-03 MED ORDER — LIDOCAINE HCL (CARDIAC) 20 MG/ML IV SOLN
INTRAVENOUS | Status: DC | PRN
  Administered 2016-12-03: 60 mg via INTRAVENOUS

## 2016-12-03 MED ORDER — VANCOMYCIN HCL 1000 MG IV SOLR
INTRAVENOUS | Status: DC | PRN
  Administered 2016-12-03: 1000 mg via INTRAVENOUS

## 2016-12-03 MED ORDER — VANCOMYCIN HCL 10 G IV SOLR
1250.0000 mg | Freq: Three times a day (TID) | INTRAVENOUS | Status: DC
  Administered 2016-12-04: 1250 mg via INTRAVENOUS
  Filled 2016-12-03 (×2): qty 1250

## 2016-12-03 MED ORDER — FENTANYL CITRATE (PF) 100 MCG/2ML IJ SOLN
INTRAMUSCULAR | Status: DC | PRN
  Administered 2016-12-03 (×3): 100 ug via INTRAVENOUS

## 2016-12-03 MED ORDER — METOCLOPRAMIDE HCL 5 MG/ML IJ SOLN
INTRAMUSCULAR | Status: AC
  Filled 2016-12-03: qty 2

## 2016-12-03 MED ORDER — HYDROMORPHONE HCL 2 MG/ML IJ SOLN: 1.0000 mg | INTRAMUSCULAR | Status: DC | PRN

## 2016-12-03 MED ORDER — VANCOMYCIN HCL 1000 MG IV SOLR
INTRAVENOUS | Status: DC | PRN
  Administered 2016-12-03: 1000 mg

## 2016-12-03 MED ORDER — SUGAMMADEX SODIUM 200 MG/2ML IV SOLN
INTRAVENOUS | Status: DC | PRN
  Administered 2016-12-03: 200 mg via INTRAVENOUS

## 2016-12-03 MED ORDER — SODIUM CHLORIDE 0.9% FLUSH
3.0000 mL | Freq: Two times a day (BID) | INTRAVENOUS | Status: DC
  Administered 2016-12-04 – 2016-12-09 (×11): 3 mL via INTRAVENOUS

## 2016-12-03 MED ORDER — DEXTROSE 5 % IV SOLN: 500.0000 mg | Freq: Four times a day (QID) | INTRAVENOUS | Status: DC | PRN

## 2016-12-03 MED ORDER — THROMBIN 5000 UNITS EX SOLR
CUTANEOUS | Status: AC
  Filled 2016-12-03: qty 5000

## 2016-12-03 MED ORDER — LACTATED RINGERS IV SOLN
INTRAVENOUS | Status: DC | PRN
Start: 2016-12-03 — End: 2016-12-03
  Administered 2016-12-03 (×2): via INTRAVENOUS

## 2016-12-03 MED ORDER — MIDAZOLAM HCL 2 MG/2ML IJ SOLN
INTRAMUSCULAR | Status: AC
  Filled 2016-12-03: qty 2

## 2016-12-03 MED ORDER — METOCLOPRAMIDE HCL 5 MG/ML IJ SOLN
INTRAMUSCULAR | Status: DC | PRN
  Administered 2016-12-03: 10 mg via INTRAVENOUS

## 2016-12-03 MED ORDER — SODIUM CHLORIDE 0.9 % IV BOLUS (SEPSIS)
1000.0000 mL | Freq: Once | INTRAVENOUS | Status: AC
  Administered 2016-12-03: 1000 mL via INTRAVENOUS

## 2016-12-03 MED ORDER — ROCURONIUM BROMIDE 100 MG/10ML IV SOLN
INTRAVENOUS | Status: DC | PRN
  Administered 2016-12-03: 50 mg via INTRAVENOUS

## 2016-12-03 MED ORDER — VECURONIUM BROMIDE 10 MG IV SOLR
INTRAVENOUS | Status: AC
  Filled 2016-12-03: qty 10

## 2016-12-03 MED ORDER — LIDOCAINE-EPINEPHRINE (PF) 2 %-1:200000 IJ SOLN
INTRAMUSCULAR | Status: DC | PRN
  Administered 2016-12-03: 10 mL

## 2016-12-03 MED ORDER — ONDANSETRON HCL 4 MG/2ML IJ SOLN
INTRAMUSCULAR | Status: AC
  Filled 2016-12-03: qty 2

## 2016-12-03 MED ORDER — PROPOFOL 10 MG/ML IV BOLUS
INTRAVENOUS | Status: DC | PRN
  Administered 2016-12-03: 130 mg via INTRAVENOUS

## 2016-12-03 MED ORDER — HYDROMORPHONE HCL 1 MG/ML IJ SOLN
0.5000 mg | INTRAMUSCULAR | Status: DC | PRN
  Administered 2016-12-03 – 2016-12-10 (×34): 1 mg via INTRAVENOUS
  Filled 2016-12-03 (×35): qty 1

## 2016-12-03 MED ORDER — CEFAZOLIN SODIUM-DEXTROSE 2-3 GM-% IV SOLR
INTRAVENOUS | Status: DC | PRN
  Administered 2016-12-03: 2 g via INTRAVENOUS

## 2016-12-03 MED ORDER — ACETAMINOPHEN 325 MG PO TABS
650.0000 mg | ORAL_TABLET | ORAL | Status: DC | PRN
  Administered 2016-12-04 – 2016-12-07 (×7): 650 mg via ORAL
  Filled 2016-12-03 (×8): qty 2

## 2016-12-03 MED ORDER — VANCOMYCIN HCL 1000 MG IV SOLR
INTRAVENOUS | Status: AC
Start: 2016-12-03 — End: 2016-12-03
  Filled 2016-12-03: qty 1000

## 2016-12-03 MED ORDER — THROMBIN 5000 UNITS EX SOLR
OROMUCOSAL | Status: DC | PRN
  Administered 2016-12-03: 5 mL via TOPICAL

## 2016-12-03 SURGICAL SUPPLY — 65 items
BAG DECANTER FOR FLEXI CONT (MISCELLANEOUS) ×3 IMPLANT
BENZOIN TINCTURE PRP APPL 2/3 (GAUZE/BANDAGES/DRESSINGS) ×3 IMPLANT
BLADE CLIPPER SURG (BLADE) IMPLANT
BLADE SURG 11 STRL SS (BLADE) ×3 IMPLANT
BUR MATCHSTICK NEURO 3.0 LAGG (BURR) ×3 IMPLANT
BUR PRECISION FLUTE 6.0 (BURR) ×3 IMPLANT
CANISTER SUCT 3000ML PPV (MISCELLANEOUS) ×3 IMPLANT
CARTRIDGE OIL MAESTRO DRILL (MISCELLANEOUS) ×1 IMPLANT
CLOSURE WOUND 1/2 X4 (GAUZE/BANDAGES/DRESSINGS) ×1
DECANTER SPIKE VIAL GLASS SM (MISCELLANEOUS) ×3 IMPLANT
DERMABOND ADVANCED (GAUZE/BANDAGES/DRESSINGS) ×2
DERMABOND ADVANCED .7 DNX12 (GAUZE/BANDAGES/DRESSINGS) ×1 IMPLANT
DIFFUSER DRILL AIR PNEUMATIC (MISCELLANEOUS) ×3 IMPLANT
DRAPE HALF SHEET 40X57 (DRAPES) IMPLANT
DRAPE LAPAROTOMY 100X72X124 (DRAPES) ×3 IMPLANT
DRAPE MICROSCOPE LEICA (MISCELLANEOUS) ×3 IMPLANT
DRAPE POUCH INSTRU U-SHP 10X18 (DRAPES) ×3 IMPLANT
DRAPE SURG 17X23 STRL (DRAPES) ×3 IMPLANT
DRSG OPSITE 4X5.5 SM (GAUZE/BANDAGES/DRESSINGS) ×3 IMPLANT
DRSG OPSITE POSTOP 4X6 (GAUZE/BANDAGES/DRESSINGS) ×3 IMPLANT
DURAPREP 26ML APPLICATOR (WOUND CARE) ×3 IMPLANT
ELECT BLADE 4.0 EZ CLEAN MEGAD (MISCELLANEOUS) ×3
ELECT REM PT RETURN 9FT ADLT (ELECTROSURGICAL) ×3
ELECTRODE BLDE 4.0 EZ CLN MEGD (MISCELLANEOUS) ×1 IMPLANT
ELECTRODE REM PT RTRN 9FT ADLT (ELECTROSURGICAL) ×1 IMPLANT
GAUZE SPONGE 4X4 12PLY STRL (GAUZE/BANDAGES/DRESSINGS) ×3 IMPLANT
GAUZE SPONGE 4X4 16PLY XRAY LF (GAUZE/BANDAGES/DRESSINGS) ×3 IMPLANT
GLOVE BIO SURGEON STRL SZ 6.5 (GLOVE) ×6 IMPLANT
GLOVE BIO SURGEON STRL SZ7 (GLOVE) ×3 IMPLANT
GLOVE BIO SURGEON STRL SZ8 (GLOVE) ×3 IMPLANT
GLOVE BIO SURGEONS STRL SZ 6.5 (GLOVE) ×3
GLOVE BIOGEL PI IND STRL 6.5 (GLOVE) ×2 IMPLANT
GLOVE BIOGEL PI INDICATOR 6.5 (GLOVE) ×4
GLOVE ECLIPSE 7.5 STRL STRAW (GLOVE) IMPLANT
GLOVE EXAM NITRILE LRG STRL (GLOVE) IMPLANT
GLOVE EXAM NITRILE XL STR (GLOVE) IMPLANT
GLOVE EXAM NITRILE XS STR PU (GLOVE) IMPLANT
GLOVE INDICATOR 8.5 STRL (GLOVE) ×3 IMPLANT
GOWN STRL REUS W/ TWL LRG LVL3 (GOWN DISPOSABLE) ×1 IMPLANT
GOWN STRL REUS W/ TWL XL LVL3 (GOWN DISPOSABLE) ×2 IMPLANT
GOWN STRL REUS W/TWL 2XL LVL3 (GOWN DISPOSABLE) IMPLANT
GOWN STRL REUS W/TWL LRG LVL3 (GOWN DISPOSABLE) ×2
GOWN STRL REUS W/TWL XL LVL3 (GOWN DISPOSABLE) ×4
KIT BASIN OR (CUSTOM PROCEDURE TRAY) ×3 IMPLANT
KIT ROOM TURNOVER OR (KITS) ×3 IMPLANT
NEEDLE HYPO 22GX1.5 SAFETY (NEEDLE) ×3 IMPLANT
NEEDLE SPNL 22GX3.5 QUINCKE BK (NEEDLE) ×3 IMPLANT
NS IRRIG 1000ML POUR BTL (IV SOLUTION) ×3 IMPLANT
OIL CARTRIDGE MAESTRO DRILL (MISCELLANEOUS) ×3
PACK LAMINECTOMY NEURO (CUSTOM PROCEDURE TRAY) ×3 IMPLANT
RUBBERBAND STERILE (MISCELLANEOUS) ×6 IMPLANT
SPONGE GAUZE 4X4 12PLY STER LF (GAUZE/BANDAGES/DRESSINGS) ×3 IMPLANT
SPONGE LAP 4X18 X RAY DECT (DISPOSABLE) ×6 IMPLANT
SPONGE SURGIFOAM ABS GEL SZ50 (HEMOSTASIS) ×3 IMPLANT
STRIP CLOSURE SKIN 1/2X4 (GAUZE/BANDAGES/DRESSINGS) ×2 IMPLANT
STRIP SURGICAL 1/2 X 6 IN (GAUZE/BANDAGES/DRESSINGS) ×3 IMPLANT
SUT VIC AB 0 CT1 18XCR BRD8 (SUTURE) ×2 IMPLANT
SUT VIC AB 0 CT1 8-18 (SUTURE) ×4
SUT VIC AB 2-0 CT1 18 (SUTURE) ×3 IMPLANT
SUT VICRYL 4-0 PS2 18IN ABS (SUTURE) ×3 IMPLANT
SWAB CULTURE LIQ STUART DBL (MISCELLANEOUS) ×12 IMPLANT
TOWEL OR 17X24 6PK STRL BLUE (TOWEL DISPOSABLE) ×3 IMPLANT
TOWEL OR 17X26 10 PK STRL BLUE (TOWEL DISPOSABLE) ×3 IMPLANT
TUBE ANAEROBIC SPECIMEN COL (MISCELLANEOUS) ×12 IMPLANT
WATER STERILE IRR 1000ML POUR (IV SOLUTION) ×3 IMPLANT

## 2016-12-03 NOTE — Anesthesia Preprocedure Evaluation (Addendum)
Anesthesia Evaluation  Patient identified by MRN, date of birth, ID band Patient awake    Reviewed: Allergy & Precautions, H&P , NPO status , Patient's Chart, lab work & pertinent test results  Airway Mallampati: II  TM Distance: >3 FB Neck ROM: Full    Dental no notable dental hx. (+) Teeth Intact, Dental Advisory Given   Pulmonary Current Smoker,    Pulmonary exam normal breath sounds clear to auscultation       Cardiovascular negative cardio ROS   Rhythm:Regular Rate:Normal     Neuro/Psych negative neurological ROS  negative psych ROS   GI/Hepatic negative GI ROS, Neg liver ROS,   Endo/Other  negative endocrine ROS  Renal/GU negative Renal ROS  negative genitourinary   Musculoskeletal   Abdominal   Peds  Hematology negative hematology ROS (+)   Anesthesia Other Findings   Reproductive/Obstetrics negative OB ROS                             Anesthesia Physical Anesthesia Plan  ASA: II  Anesthesia Plan: General   Post-op Pain Management:    Induction: Intravenous  Airway Management Planned: Oral ETT  Additional Equipment:   Intra-op Plan:   Post-operative Plan: Extubation in OR  Informed Consent: I have reviewed the patients History and Physical, chart, labs and discussed the procedure including the risks, benefits and alternatives for the proposed anesthesia with the patient or authorized representative who has indicated his/her understanding and acceptance.   Dental advisory given  Plan Discussed with: CRNA  Anesthesia Plan Comments:         Anesthesia Quick Evaluation  

## 2016-12-03 NOTE — Anesthesia Postprocedure Evaluation (Signed)
Anesthesia Post Note  Patient: Larry Best  Procedure(s) Performed: Procedure(s) (LRB): DECOMPRESSIVE LUMBAR LAMINECTOMY FOR EPIDURAL ABSCESS LUMBAR FOUR- SACRAL ONE (N/A)  Patient location during evaluation: PACU Anesthesia Type: General Level of consciousness: awake and alert Pain management: pain level controlled Vital Signs Assessment: post-procedure vital signs reviewed and stable Respiratory status: spontaneous breathing, nonlabored ventilation, respiratory function stable and patient connected to nasal cannula oxygen Cardiovascular status: blood pressure returned to baseline and stable Postop Assessment: no signs of nausea or vomiting Anesthetic complications: no       Last Vitals:  Vitals:   12/03/16 2155 12/03/16 2236  BP: 131/84 (!) 150/93  Pulse: (!) 104 (!) 108  Resp: (!) 23 20  Temp:  36.7 C    Last Pain:  Vitals:   12/03/16 2248  TempSrc:   PainSc: 10-Worst pain ever                 [name],W. EDMOND

## 2016-12-03 NOTE — ED Notes (Signed)
Report was called to 74M RN, updated on surgery timeframe and report then to come from OR Nurse

## 2016-12-03 NOTE — Progress Notes (Signed)
Patient ID: Luisa HartCharles Z Moist, male   DOB: 07/11/1986, 30 y.o.   MRN: [id number] Addendum:  Patient apparently was doing IV heroine 9 months ago

## 2016-12-03 NOTE — ED Provider Notes (Signed)
[facility]-EMERGENCY DEPT Provider Note   CSN: [id number] Arrival date & time: 12/03/16  1407     History   Chief Complaint Chief Complaint  Patient presents with  . Back Pain    HPI Larry Best is a 30 y.o. male.  HPI  30 year old male who presents with low back pain. Onset of back pain 7 days ago after mechanical fall in the shower. Initially noted a dull achy pain in the low back, that gradually worsened over the course of the week. Seen in the emergency department on December 17 and treated for musculoskeletal back pain as exam normal. Has been seen by his primary care doctor for worsening pain despite outpatient steroids and anti-inflammatory medications and sent for MRI today. Had MRI concerning for epidural abscess and sent to the ED by his primary care doctor.  During this time his had subjective fevers and chills with night sweats. Has occasional shooting numbness down bilateral legs. Reports feeling weak in his legs due to pain. Denies any bowel incontinence but does has sensation of difficulty urinating at times with sensation of bladder being full. Denies IVDU or history of back surgery.  History reviewed. No pertinent past medical history.  Patient Active Problem List   Diagnosis Date Noted  . 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 No pertinent surgical history.     Home Medications    Prior to Admission medications   Medication Sig Start Date End Date Taking? Authorizing Provider  cyclobenzaprine (FLEXERIL) 10 MG tablet Take 1 tablet (10 mg total) by mouth 2 (two) times daily as needed for muscle spasms. 11/28/16  Yes Arnetha Courser, MD  HYDROcodone-acetaminophen (NORCO/VICODIN) 5-325 MG tablet Take 2 tablets by mouth every 4 (four) hours as needed. Patient not taking: Reported on 12/03/2016 11/28/16   Arnetha Courser, MD  ibuprofen (ADVIL,MOTRIN) 800 MG tablet Take 1 tablet (800 mg total) by mouth 3 (three) times daily. Patient not taking:  Reported on 12/03/2016 11/28/16   Arnetha Courser, MD    Family History No family history on file.  Social History Social History  Substance Use Topics  . Smoking status: Current Every Day Smoker    Packs/day: 0.50    Types: Cigarettes  . Smokeless tobacco: Not on file  . Alcohol use Yes     Allergies   Patient has no known allergies.   Review of Systems Review of Systems 10/14 systems reviewed and are negative other than those stated in the HPI   Physical Exam Updated Vital Signs BP 128/72   Pulse 86   Temp 97.6 F (36.4 C) (Oral)   Resp 18   Ht 5\' 9"  (1.753 m)   Wt 222 lb (100.7 kg)   SpO2 93%   BMI 32.78 kg/m   Physical Exam Physical Exam  Nursing note and vitals reviewed. Constitutional: Well developed, well nourished, non-toxic, and in moderate distress secondary to pain Head: Normocephalic and atraumatic.  Mouth/Throat: Oropharynx is clear and moist.  Neck: Normal range of motion. Neck supple.  Cardiovascular: Normal rate and regular rhythm.  +2 bilateral DP pulses Pulmonary/Chest: Effort normal and breath sounds normal.  Abdominal: Soft. There is no tenderness. There is no rebound and no guarding.  Musculoskeletal: No overlying skin changes over back. Lumbar spine tenderness, midline and paraspinal to palpation Neurological: Alert, no facial droop, fluent speech, moves all extremities symmetrically, full strength in bilateral ankle dorsi/plantarflexion, sensation to light touch in tact in bilateral lower extremities Skin: Skin is warm and  dry.  Psychiatric: Cooperative   ED Treatments / Results  Labs (all labs ordered are listed, but only abnormal results are displayed) Labs Reviewed  CULTURE, BLOOD (ROUTINE X 2)  CULTURE, BLOOD (ROUTINE X 2)  CBC WITH DIFFERENTIAL/PLATELET  BASIC METABOLIC PANEL  SEDIMENTATION RATE  C-REACTIVE PROTEIN  PROTIME-INR  APTT  I-STAT CG4 LACTIC ACID, ED  TYPE AND SCREEN    EKG  EKG Interpretation None        Radiology Mr Lumbar Spine Wo Contrast  Result Date: 12/03/2016 CLINICAL DATA:  Severe low back and bilateral leg pain since the patient fell in the bathtub 11/27/2016. EXAM: MRI LUMBAR SPINE WITHOUT CONTRAST TECHNIQUE: Multiplanar, multisequence MR imaging of the lumbar spine was performed. No intravenous contrast was administered. COMPARISON:  Plain films lumbar spine 11/28/2016. FINDINGS: Segmentation:  Standard. Alignment:  Maintained. Vertebrae: No fracture. There is some marrow edema about the left and right L4-5 facet joints. Conus medullaris: Extends to the L1 level and appears normal. Paraspinal and other soft tissues: Extensive edema is seen in paraspinous musculature from the L1-2 level into the sacral segments. Edematous change is worst from mid L3 to S1-2. Focal fluid collections in paraspinous musculature are seen. On the left, the collection is centered at the level of the L5-S1 facet joints and measures 1.4 cm transverse by 2.1 cm by 5 cm craniocaudal. Collection on the right is centered at the level of the L5 pedicles measuring 1.6 cm transverse by 1.7 cm AP by 3.5 cm craniocaudal. Edema is also seen in the posterior aspect of the left psoas muscle. Disc levels: A posterior epidural collection is seen extending from approximately mid L4 to approximately S2. The collection measures up to 1.1 cm transverse by 0.9 cm AP by 7.8 cm craniocaudal. The collection causes marked compression of the thecal sac appearing worst from at L4-5 to the S1-2 level. The entire extent of the collection is not imaged in the axial plane. Intervertebral discs appear normal from T12-L1 to L4-5. Broad-based central protrusion at L5-S1 is identified. The foramina are open at this level. IMPRESSION: Extensive edema posterior paraspinous muscles with focal fluid collections most worrisome for pyomyositis. Epidural fluid collection as described above is most consistent with epidural abscess and causes marked compression  of the thecal sac from L4-5 into the sacral segments. Given history of fall, hematoma is possible but highly unlikely. Marrow edema in the L4-5 facet joints worrisome for osteomyelitis. Broad-based central protrusion L5-S1 contributes to central canal stenosis in conjunction with epidural fluid. Critical Value/emergent results were called by telephone at the time of interpretation on 12/03/2016 at 1:42 pm to Dr. Deatra JamesVYVYAN SUN , who verbally acknowledged these results. Electronically Signed   By: Drusilla Kannerhomas  Dalessio M.D.   On: 12/03/2016 13:46    Procedures Procedures (including critical care time) CRITICAL CARE Performed by: Lavera Guiseana Duo Liu   Total critical care time: 40 minutes  Critical care time was exclusive of separately billable procedures and treating other patients.  Critical care was necessary to treat or prevent imminent or life-threatening deterioration.  Critical care was time spent personally by me on the following activities: development of treatment plan with patient and/or surrogate as well as nursing, discussions with consultants, evaluation of patient's response to treatment, examination of patient, obtaining history from patient or surrogate, ordering and performing treatments and interventions, ordering and review of laboratory studies, ordering and review of radiographic studies, pulse oximetry and re-evaluation of patient's condition.  Medications Ordered in ED Medications  0.9 %  sodium chloride infusion (not administered)  ketorolac (TORADOL) 30 MG/ML injection 30 mg (not administered)  HYDROmorphone (DILAUDID) injection 1 mg (not administered)  methocarbamol (ROBAXIN) 500 mg in dextrose 5 % 50 mL IVPB (not administered)  HYDROmorphone (DILAUDID) injection 1 mg (1 mg Intravenous Given 12/03/16 1525)  sodium chloride 0.9 % bolus 1,000 mL (1,000 mLs Intravenous New Bag/Given 12/03/16 1545)  HYDROmorphone (DILAUDID) injection 1 mg (1 mg Intravenous Given 12/03/16 1614)      Initial Impression / Assessment and Plan / ED Course  I have reviewed the triage vital signs and the nursing notes.  Pertinent labs & imaging results that were available during my care of the patient were reviewed by me and considered in my medical decision making (see chart for details).  Clinical Course    Records reviewed. MRI lumbar spine performed outpatient this morning:   IMPRESSION: Extensive edema posterior paraspinous muscles with focal fluid collections most worrisome for pyomyositis. Epidural fluid collection as described above is most consistent with epidural abscess and causes marked compression of the thecal sac from L4-5 into the sacral segments. Given history of fall, hematoma is possible but highly unlikely. Marrow edema in the L4-5 facet joints worrisome for osteomyelitis. Broad-based central protrusion L5-S1 contributes to central canal stenosis in conjunction with epidural fluid.   Afebrile and HD stable. Grossly neuro in tact. Obtained blood cultures and basic blood work.   Spoke with Dr. Wynetta Emeryram who will plan to operate. Keep NPO. Hold antibiotics until surgical cultures obtained.  Discussed with hospitalist service. Will admit for ongoing management  Final Clinical Impressions(s) / ED Diagnoses   Final diagnoses:  Epidural abscess    New Prescriptions New Prescriptions   No medications on file     Lavera Guiseana Duo Liu, MD 12/03/16 1657

## 2016-12-03 NOTE — Op Note (Signed)
Preoperative diagnosis: Lumbar spinal epidural abscess  Postoperative diagnosis: Same  Procedure: Bilateral and Complete laminectomies of L4, L5, and S1 with medial facetectomies at L4-5 L5-S1 and foraminotomies of the L4, L5, S1, and S2 nerve roots.  Surgeon: Jillyn HiddenGary Tejal Monroy  Anesthesia: Gen.  EBL: Minimal  History of present illness: Patient is a 30 year old gentleman who presents to the ER a week out after a fall with progress worsening back pain intermittent fevers and chills occasional pain going down his legs and had a couple episodes where he felt like he couldn't empty his bladder completely. Workup in the ER revealed patient is neurologically intact strength MRI scan showed a possible abscess versus hematoma and due to severe thecal sac compression possibly being epidural abscess recommended decompressive laminectomy and aspiration and cultures. I extensively went over the risks and benefits of that operation with him as well as perioperative course expectations of outcome and alternatives of surgery and he understood and agreed to proceed forward.  Operative procedure: Patient brought in the or was induced under general anesthesia positioned prone the Wilson frame his back was prepped and draped in routine sterile fashion after infiltration of 10 mL lidocaine with epi a midline incision was made and Bovie light car was used to calcification subperiosteal dissection was carried out on the lamina of L4 and L5 bilaterally. Doing a subperiosteal dissection a dense amount of purulent material came up from the paraspinal space on the left side under pressure. This was all sent for cultures. I then exposed the L4-L5 and S1 lamina inserted the self-retaining retractors. Spinous processes at L4, L5, and S1 with a removed central decompression was begun first with a high-speed drill and then with a 3 and 4 mm Kerrison punch the epidural space also immediately expressed highly parallel liquid that was also  sent for cultures. There was also purulence coming out of the left L4-5 facet. All this was sent for cultures. Complete laminectomy of L5 and L4 carried out under biting the medial gutter perform foraminotomies decompress the central canal which was under a marked amount of stenosis there was a dense amount of granulation tissue at the level of the L4 lamina and marched down. All material was still coming out from underneath the S1 so I carried laminotomy inferiorly with a complete laminotomy of S1 bilaterally. At the interest of the S1 lamina at the S1-1 disc space level there was no further. Material appreciated marching up the lateral gutters also identified no further material. A material after complete bilateral decompressive laminotomies had been were completed wounds and Coban see her get meticulous he states was maintained Surgifoam was sprayed overlying the thecal sac a medium Hemovac drain was placed the wounds closed in layers with interrupted Vicryl skin was closed running 4 subcuticular Dermabond benzo and Steri-Strips and a sterile dressing was applied and patient recovered in stable condition. At the end of case all needle counts and sponge counts were correct.

## 2016-12-03 NOTE — H&P (Signed)
History and Physical    Larry Best QCA:[id number] DOB: November 08, 1986 DOA: 12/03/2016   PCP: [facility]   Patient coming from/Resides with: Private residence  Admission status: Inpatient/neuro-telemetry -medically necessary to stay a minimum 2 midnights to rule out impending and/or unexpected changes in physiologic status that may differ from initial evaluation performed in the ER and/or at time of admission. Patient presents with severe back pain after mechanical fall without neurological deficits. Treated symptomatically with analgesics and nonsteroidals without improvement in symptoms. MRI today reveals epidural abscess. Neurosurgery plans urgent operative intervention tonight.  Chief Complaint: Unrelenting back pain after mechanical fall  HPI: Larry Best is a 30 y.o. male without any significant medical history. Patient experienced a mechanical fall in the shower and was evaluated in the ER on 1217 with normal exam including normal plain x-rays. Symptoms were treated with narcotic analgesics and nonsteroidal anti-inflammatory drugs. Unfortunately pain persisted and he presented to his primary care physician. He was having leg weakness without true neurological deficit but due to the intensity of his symptoms and MRI was ordered. This revealed extensive edema in the posterior paraspinous muscles with focal fluid collections most worrisome for pyomyositis. There was also an epidural fluid collection most consistent with epidural abscess with marked compression of the thecal sac from L4-5 into the sacral segments. EDP contacted neurosurgery who reviewed the films and examined the patient and determined the patient will need urgent surgical intervention tonight. Neurosurgery has requested internal medicine admission.  ED Course:  Vital Signs: BP 139/79   Pulse 89   Temp 97.6 F (36.4 C) (Oral)   Resp 24   Ht 5\' 9"  (1.753 m)   Wt 100.7 kg (222 lb)   SpO2 96%    BMI 32.78 kg/m  MRI lumbar spine without contrast: As described in the history of present illness Lab data: Lactic acid 1.18, PTT, PT/INR, ESR, C-reactive protein, CBC with differential and metabolic panel pending at time of admission. Blood cultures obtained in the ER Medications and treatments: Dilaudid 1 mg IV 2, normal saline IV fluids,  Review of Systems:  In addition to the HPI above,  No Fever-chills, myalgias or other constitutional symptoms No Headache, changes with Vision or hearing, new weakness, tingling, numbness in any extremity, dizziness, dysarthria or word finding difficulty, gait disturbance or imbalance, tremors or seizure activity No problems swallowing food or Liquids, indigestion/reflux, choking or coughing while eating, abdominal pain with or after eating No Chest pain, Cough or Shortness of Breath, palpitations, orthopnea or DOE No Abdominal pain, N/V, melena,hematochezia, dark tarry stools, constipation No dysuria, malodorous urine, hematuria or flank pain No new skin rashes, lesions, masses or bruises, No new joint pains, aches, swelling or redness other than low back pain as described above No recent unintentional weight gain or loss No polyuria, polydypsia or polyphagia   History reviewed. No pertinent past medical history.  History reviewed. No pertinent surgical history.  Social History   Social History  . Marital status: Single    Spouse name: N/A  . Number of children: N/A  . Years of education: N/A   Occupational History  . Not on file.   Social History Main Topics  . Smoking status: Current Every Day Smoker    Packs/day: 0.50    Types: Cigarettes  . Smokeless tobacco: Not on file  . Alcohol use Yes  . Drug use: No  . Sexual activity: Not on file   Other Topics Concern  . Not  on file   Social History Narrative  . No narrative on file    Mobility: Prior to injury without assistive devices Work history: Not obtained   No Known  Allergies   Family history reviewed and not pertinent to current admission findings and diagnosis   Prior to Admission medications   Medication Sig Start Date End Date Taking? Authorizing Provider  cyclobenzaprine (FLEXERIL) 10 MG tablet Take 1 tablet (10 mg total) by mouth 2 (two) times daily as needed for muscle spasms. 11/28/16  Yes Lorella Nimrod, MD  HYDROcodone-acetaminophen (NORCO/VICODIN) 5-325 MG tablet Take 2 tablets by mouth every 4 (four) hours as needed. Patient not taking: Reported on 12/03/2016 11/28/16   Lorella Nimrod, MD  ibuprofen (ADVIL,MOTRIN) 800 MG tablet Take 1 tablet (800 mg total) by mouth 3 (three) times daily. Patient not taking: Reported on 12/03/2016 11/28/16   Lorella Nimrod, MD    Physical Exam: Vitals:   12/03/16 1419 12/03/16 1445 12/03/16 1445  BP: 140/77 139/79   Pulse: 118 89   Resp: 22 24   Temp: 97.6 F (36.4 C)    TempSrc: Oral    SpO2: 100% 96%   Weight:   100.7 kg (222 lb)  Height:   '5\' 9"'$  (1.753 m)      Constitutional: NAD, calmBut very uncomfortable secondary to unrelenting back pain Eyes: PERRL, lids and conjunctivae normal ENMT: Mucous membranes are moist. Posterior pharynx clear of any exudate or lesions.Normal dentition.  Neck: normal, supple, no masses, no thyromegaly Respiratory: clear to auscultation bilaterally, no wheezing, no crackles. Normal respiratory effort. No accessory muscle use.  Cardiovascular: Regular rate and rhythm, no murmurs / rubs / gallops. No extremity edema. 2+ pedal pulses. No carotid bruits.  Abdomen: tenderness with palpation over abdominal quadrants with patient reporting that palpation over abdominal wall causes back pain/radiates to back, no masses palpated. No hepatosplenomegaly. Bowel sounds positive.  Musculoskeletal: no clubbing / cyanosis. No joint deformity upper and lower extremities. Good ROM, no contractures. Normal muscle tone.  Skin: no rashes, lesions, ulcers. No induration Neurologic: CN 2-12  grossly intact. Sensation intact, DTR normal. Strength 5/5 x all 4 extremities although lower extremity exam limited by pain Psychiatric: Normal judgment and insight. Alert and oriented x 3. Anxious mood secondary to unrelenting back pain.    Labs on Admission: I have personally reviewed following labs and imaging studies  CBC:  Recent Labs Lab 11/28/16 1000  HGB 14.6  HCT 93.7   Basic Metabolic Panel:  Recent Labs Lab 11/28/16 1000  NA 137  K 4.6  CL 103  GLUCOSE 94  BUN 8  CREATININE 0.90   GFR: Estimated Creatinine Clearance: 141.7 mL/min (by C-G formula based on SCr of 0.9 mg/dL). Liver Function Tests: No results for input(s): AST, ALT, ALKPHOS, BILITOT, PROT, ALBUMIN in the last 168 hours. No results for input(s): LIPASE, AMYLASE in the last 168 hours. No results for input(s): AMMONIA in the last 168 hours. Coagulation Profile: No results for input(s): INR, PROTIME in the last 168 hours. Cardiac Enzymes: No results for input(s): CKTOTAL, CKMB, CKMBINDEX, TROPONINI in the last 168 hours. BNP (last 3 results) No results for input(s): PROBNP in the last 8760 hours. HbA1C: No results for input(s): HGBA1C in the last 72 hours. CBG: No results for input(s): GLUCAP in the last 168 hours. Lipid Profile: No results for input(s): CHOL, HDL, LDLCALC, TRIG, CHOLHDL, LDLDIRECT in the last 72 hours. Thyroid Function Tests: No results for input(s): TSH, T4TOTAL, FREET4, T3FREE, THYROIDAB in  the last 72 hours. Anemia Panel: No results for input(s): VITAMINB12, FOLATE, FERRITIN, TIBC, IRON, RETICCTPCT in the last 72 hours. Urine analysis:    Component Value Date/Time   COLORURINE YELLOW 03/21/2014 1109   APPEARANCEUR CLEAR 03/21/2014 1109   LABSPEC 1.022 03/21/2014 1109   PHURINE 7.0 03/21/2014 1109   GLUCOSEU NEGATIVE 03/21/2014 1109   [place] 03/21/2014 1109   BILIRUBINUR NEGATIVE 03/21/2014 1109   KETONESUR NEGATIVE 03/21/2014 1109   PROTEINUR NEGATIVE  03/21/2014 1109   UROBILINOGEN 0.2 03/21/2014 1109   NITRITE NEGATIVE 03/21/2014 1109   LEUKOCYTESUR NEGATIVE 03/21/2014 1109   Sepsis Labs: '@LABRCNTIP'$ (procalcitonin:4,lacticidven:4) )No results found for this or any previous visit (from the past 240 hour(s)).   Radiological Exams on Admission: Mr Lumbar Spine Wo Contrast  Result Date: 12/03/2016 CLINICAL DATA:  Severe low back and bilateral leg pain since the patient fell in the bathtub 11/27/2016. EXAM: MRI LUMBAR SPINE WITHOUT CONTRAST TECHNIQUE: Multiplanar, multisequence MR imaging of the lumbar spine was performed. No intravenous contrast was administered. COMPARISON:  Plain films lumbar spine 11/28/2016. FINDINGS: Segmentation:  Standard. Alignment:  Maintained. Vertebrae: No fracture. There is some marrow edema about the left and right L4-5 facet joints. Conus medullaris: Extends to the L1 level and appears normal. Paraspinal and other soft tissues: Extensive edema is seen in paraspinous musculature from the L1-2 level into the sacral segments. Edematous change is worst from mid L3 to S1-2. Focal fluid collections in paraspinous musculature are seen. On the left, the collection is centered at the level of the L5-S1 facet joints and measures 1.4 cm transverse by 2.1 cm by 5 cm craniocaudal. Collection on the right is centered at the level of the L5 pedicles measuring 1.6 cm transverse by 1.7 cm AP by 3.5 cm craniocaudal. Edema is also seen in the posterior aspect of the left psoas muscle. Disc levels: A posterior epidural collection is seen extending from approximately mid L4 to approximately S2. The collection measures up to 1.1 cm transverse by 0.9 cm AP by 7.8 cm craniocaudal. The collection causes marked compression of the thecal sac appearing worst from at L4-5 to the S1-2 level. The entire extent of the collection is not imaged in the axial plane. Intervertebral discs appear normal from T12-L1 to L4-5. Broad-based central protrusion at  L5-S1 is identified. The foramina are open at this level. IMPRESSION: Extensive edema posterior paraspinous muscles with focal fluid collections most worrisome for pyomyositis. Epidural fluid collection as described above is most consistent with epidural abscess and causes marked compression of the thecal sac from L4-5 into the sacral segments. Given history of fall, hematoma is possible but highly unlikely. Marrow edema in the L4-5 facet joints worrisome for osteomyelitis. Broad-based central protrusion L5-S1 contributes to central canal stenosis in conjunction with epidural fluid. Critical Value/emergent results were called by telephone at the time of interpretation on 12/03/2016 at 1:42 pm to Dr. Donald Prose , who verbally acknowledged these results. Electronically Signed   By: Inge Rise M.D.   On: 12/03/2016 13:46    Assessment/Plan Principal Problem:   Spinal epidural abscess -Patient presents with unrelenting back pain after mechanical fall with MRI revealing areas of pyomyositis and edema as well as a focal area consistent with an epidural abscess -NPO/IVFs -Neurosurgeon/Cram plans urgent surgical intervention tonight -Neurosurgery also requests no preoperative/pre-culture antibiotics -IV Dilaudid, IV Toradol (x 1) and IV Robaxin to help with pain -Likely may require ID consultation postoperatively once cultures returned to determine appropriateness of antibiotic and duration  of therapy -Postoperative PT/OT at discretion of neurosurgery      DVT prophylaxis: SCDs-initiation of pharmacological DVT prophylaxis will be at discretion of neurosurgeon  Code Status: Full Family Communication: No family at bedside at time of evaluation  Disposition Plan: Likely return to preadmission home environment but this will be at discretion of neurosurgeon and pending postoperative course/PT and OT evaluation Consults called: Neurosurgery/Cram     Rosmarie Esquibel L. ANP-BC [facility] Pager [phone]   If 7PM-7AM, please contact night-coverage [email or web address] Password [facility]  12/03/2016, 4:35 PM

## 2016-12-03 NOTE — Consult Note (Signed)
Reason for Consult: Back pain leg pain Referring Physician: Emergency department  Larry Best is an 30 y.o. male.  HPI: 30 year old gentleman who fell metallic last Saturday and since then has been experiencing severe low back pain. He went to the ER on Sunday was evaluated was treated with anti-inflammatories muscle relaxers plain x-rays were unremarkable. Was sent home on pain medication patient followed up with medical doctor underwent MRI scan was referred here when the MRI scan showed possible epidural abscess/epidural hematoma. Patient does report pain down both legs left greater right denies any numbness and tingling in his feet denies any bowel or bladder difficulties. Has been having chills but no documented fever denies any other significant past history exposure is travel infections that he knows of.  History reviewed. No pertinent past medical history.  History reviewed. No pertinent surgical history.  No family history on file.  Social History:  reports that he has been smoking Cigarettes.  He has been smoking about 0.50 packs per day. He does not have any smokeless tobacco history on file. He reports that he drinks alcohol. He reports that he does not use drugs.  Allergies: No Known Allergies  Medications: I have reviewed the patient's current medications.  No results found for this or any previous visit (from the past 48 hour(s)).  Larry Best Spine Wo Contrast  Result Date: 12/03/2016 CLINICAL DATA:  Severe low back and bilateral leg pain since the patient fell in the bathtub 11/27/2016. EXAM: MRI Best SPINE WITHOUT CONTRAST TECHNIQUE: Multiplanar, multisequence Larry imaging of the Best spine was performed. No intravenous contrast was administered. COMPARISON:  Plain films Best spine 11/28/2016. FINDINGS: Segmentation:  Standard. Alignment:  Maintained. Vertebrae: No fracture. There is some marrow edema about the left and right L4-5 facet joints. Conus medullaris:  Extends to the L1 level and appears normal. Paraspinal and other soft tissues: Extensive edema is seen in paraspinous musculature from the L1-2 level into the sacral segments. Edematous change is worst from mid L3 to S1-2. Focal fluid collections in paraspinous musculature are seen. On the left, the collection is centered at the level of the L5-S1 facet joints and measures 1.4 cm transverse by 2.1 cm by 5 cm craniocaudal. Collection on the right is centered at the level of the L5 pedicles measuring 1.6 cm transverse by 1.7 cm AP by 3.5 cm craniocaudal. Edema is also seen in the posterior aspect of the left psoas muscle. Disc levels: A posterior epidural collection is seen extending from approximately mid L4 to approximately S2. The collection measures up to 1.1 cm transverse by 0.9 cm AP by 7.8 cm craniocaudal. The collection causes marked compression of the thecal sac appearing worst from at L4-5 to the S1-2 level. The entire extent of the collection is not imaged in the axial plane. Intervertebral discs appear normal from T12-L1 to L4-5. Broad-based central protrusion at L5-S1 is identified. The foramina are open at this level. IMPRESSION: Extensive edema posterior paraspinous muscles with focal fluid collections most worrisome for pyomyositis. Epidural fluid collection as described above is most consistent with epidural abscess and causes marked compression of the thecal sac from L4-5 into the sacral segments. Given history of fall, hematoma is possible but highly unlikely. Marrow edema in the L4-5 facet joints worrisome for osteomyelitis. Broad-based central protrusion L5-S1 contributes to central canal stenosis in conjunction with epidural fluid. Critical Value/emergent results were called by telephone at the time of interpretation on 12/03/2016 at 1:42 pm to Dr. Deatra JamesVYVYAN Best ,  who verbally acknowledged these results. Electronically Signed   By: Larry Kannerhomas  Dalessio M.D.   On: 12/03/2016 13:46    Review of Systems   Musculoskeletal: Positive for back pain, joint pain and myalgias.  Neurological: Positive for tingling.  All other systems reviewed and are negative.  Blood pressure 139/79, pulse 89, temperature 97.6 F (36.4 C), temperature source Oral, resp. rate 24, height 5\' 9"  (1.753 m), weight 100.7 kg (222 lb), SpO2 96 %. Physical Exam  Neurological: He has normal strength. GCS eye subscore is 4. GCS verbal subscore is 5. GCS motor subscore is 6.  Strength out of 5 iliopsoas, quads, hamstrings, gastric, into tibialis, and EHL. Sensation grossly intact to light touch    Assessment/Plan: 30 year old gentleman presents with back and left greater right leg pain and MRI scan showing possible epidural hematoma possible epidural abscess. Patient denies any weakness or difficulty with bowel bladder neurologically he is intact with 5 out of 5 strength normal and symmetric reflexes and sensation. Due to his MRI scan showing a significant compressive fluid mass either abscess or hematoma and his intractable and progressive worsening pain I've recommended decompressive laminectomy culture biopsy removal of fluid collection. I've extensively gone over the risks and benefits of that operation with him as well as perioperative course expectations of outcome and alternatives of surgery and he understands and agrees to proceed 4.  Larry Best 12/03/2016, 4:06 PM

## 2016-12-03 NOTE — Progress Notes (Signed)
Arrived from PACU to [id number]. Alert and oriented. C/O surgical  Pain 10/10. Dressing CDI. Family at bedside.

## 2016-12-03 NOTE — ED Notes (Signed)
Report called to OR Nurse RN.  Patient and belongings taken to OR short stay 36.  No signs of distress noted.

## 2016-12-03 NOTE — Transfer of Care (Signed)
Immediate Anesthesia Transfer of Care Note  Patient: Larry Best  Procedure(s) Performed: Procedure(s): DECOMPRESSIVE LUMBAR LAMINECTOMY FOR EPIDURAL ABSCESS LUMBAR FOUR- SACRAL ONE (N/A)  Patient Location: PACU  Anesthesia Type:General  Level of Consciousness: awake, oriented, sedated, patient cooperative and responds to stimulation  Airway & Oxygen Therapy: Patient Spontanous Breathing and Patient connected to nasal cannula oxygen  Post-op Assessment: Report given to RN, Post -op Vital signs reviewed and stable, Patient moving all extremities and Patient moving all extremities X 4  Post vital signs: Reviewed and stable  Last Vitals:  Vitals:   12/03/16 1715 12/03/16 1730  BP: 134/78 130/74  Pulse: 88 87  Resp:    Temp:      Last Pain:  Vitals:   12/03/16 1813  TempSrc:   PainSc: 9          Complications: No apparent anesthesia complications

## 2016-12-03 NOTE — ED Triage Notes (Signed)
Pt presents today for further evaluation of MRI results. Pt. States pain started Saturday and was seen here and given medication for muscle spasms and pain. Pt. States no improvement, PCP ordered MRI today. Emergent results concerning possible infection to spine, pt sent here for evaluation. Pt in obvious distress. 10/10 Pain.

## 2016-12-03 NOTE — Anesthesia Procedure Notes (Signed)
Procedure Name: Intubation Date/Time: 12/03/2016 6:36 PM Performed by: Wray KearnsFOLEY, Jerry Clyne A Pre-anesthesia Checklist: Patient identified, Emergency Drugs available, Suction available and Patient being monitored Patient Re-evaluated:Patient Re-evaluated prior to inductionOxygen Delivery Method: Circle System Utilized and Circle system utilized Preoxygenation: Pre-oxygenation with 100% oxygen Intubation Type: IV induction Ventilation: Mask ventilation without difficulty Laryngoscope Size: Mac and 4 Grade View: Grade I Tube type: Oral Tube size: 7.5 mm Number of attempts: 1 Airway Equipment and Method: Stylet Placement Confirmation: ETT inserted through vocal cords under direct vision,  positive ETCO2 and breath sounds checked- equal and bilateral Secured at: 22 cm Tube secured with: Tape Dental Injury: Teeth and Oropharynx as per pre-operative assessment

## 2016-12-04 ENCOUNTER — Encounter (HOSPITAL_COMMUNITY): Payer: Self-pay | Admitting: Neurosurgery

## 2016-12-04 DIAGNOSIS — F17210 Nicotine dependence, cigarettes, uncomplicated: Secondary | ICD-10-CM

## 2016-12-04 DIAGNOSIS — B9561 Methicillin susceptible Staphylococcus aureus infection as the cause of diseases classified elsewhere: Secondary | ICD-10-CM

## 2016-12-04 DIAGNOSIS — F1921 Other psychoactive substance dependence, in remission: Secondary | ICD-10-CM

## 2016-12-04 DIAGNOSIS — A4901 Methicillin susceptible Staphylococcus aureus infection, unspecified site: Secondary | ICD-10-CM

## 2016-12-04 DIAGNOSIS — F1990 Other psychoactive substance use, unspecified, uncomplicated: Secondary | ICD-10-CM

## 2016-12-04 DIAGNOSIS — M60009 Infective myositis, unspecified site: Secondary | ICD-10-CM

## 2016-12-04 DIAGNOSIS — Z9889 Other specified postprocedural states: Secondary | ICD-10-CM

## 2016-12-04 DIAGNOSIS — R519 Headache, unspecified: Secondary | ICD-10-CM

## 2016-12-04 DIAGNOSIS — M4626 Osteomyelitis of vertebra, lumbar region: Secondary | ICD-10-CM

## 2016-12-04 DIAGNOSIS — R51 Headache: Secondary | ICD-10-CM

## 2016-12-04 DIAGNOSIS — R7881 Bacteremia: Secondary | ICD-10-CM

## 2016-12-04 HISTORY — DX: Methicillin susceptible Staphylococcus aureus infection, unspecified site: A49.01

## 2016-12-04 HISTORY — DX: Other psychoactive substance use, unspecified, uncomplicated: F19.90

## 2016-12-04 HISTORY — DX: Bacteremia: R78.81

## 2016-12-04 HISTORY — DX: Methicillin susceptible Staphylococcus aureus infection as the cause of diseases classified elsewhere: B95.61

## 2016-12-04 LAB — BLOOD CULTURE ID PANEL (REFLEXED)
Acinetobacter baumannii: NOT DETECTED
CANDIDA ALBICANS: NOT DETECTED
CANDIDA GLABRATA: NOT DETECTED
CANDIDA KRUSEI: NOT DETECTED
CANDIDA PARAPSILOSIS: NOT DETECTED
CANDIDA TROPICALIS: NOT DETECTED
ENTEROBACTER CLOACAE COMPLEX: NOT DETECTED
ESCHERICHIA COLI: NOT DETECTED
Enterobacteriaceae species: NOT DETECTED
Enterococcus species: NOT DETECTED
Haemophilus influenzae: NOT DETECTED
KLEBSIELLA OXYTOCA: NOT DETECTED
KLEBSIELLA PNEUMONIAE: NOT DETECTED
Listeria monocytogenes: NOT DETECTED
Methicillin resistance: NOT DETECTED
Neisseria meningitidis: NOT DETECTED
PROTEUS SPECIES: NOT DETECTED
Pseudomonas aeruginosa: NOT DETECTED
STAPHYLOCOCCUS SPECIES: DETECTED — AB
STREPTOCOCCUS PYOGENES: NOT DETECTED
Serratia marcescens: NOT DETECTED
Staphylococcus aureus (BCID): DETECTED — AB
Streptococcus agalactiae: NOT DETECTED
Streptococcus pneumoniae: NOT DETECTED
Streptococcus species: NOT DETECTED

## 2016-12-04 LAB — CBC WITH DIFFERENTIAL/PLATELET
BASOS PCT: 0 %
Basophils Absolute: 0.1 10*3/uL (ref 0.0–0.1)
EOS ABS: 0.2 10*3/uL (ref 0.0–0.7)
Eosinophils Relative: 1 %
HCT: 33.9 % — ABNORMAL LOW (ref 39.0–52.0)
HEMOGLOBIN: 11.3 g/dL — AB (ref 13.0–17.0)
LYMPHS ABS: 3 10*3/uL (ref 0.7–4.0)
Lymphocytes Relative: 17 %
MCH: 29.6 pg (ref 26.0–34.0)
MCHC: 33.3 g/dL (ref 30.0–36.0)
MCV: 88.7 fL (ref 78.0–100.0)
Monocytes Absolute: 2 10*3/uL — ABNORMAL HIGH (ref 0.1–1.0)
Monocytes Relative: 11 %
NEUTROS PCT: 71 %
Neutro Abs: 12.7 10*3/uL — ABNORMAL HIGH (ref 1.7–7.7)
Platelets: 228 10*3/uL (ref 150–400)
RBC: 3.82 MIL/uL — AB (ref 4.22–5.81)
RDW: 14.2 % (ref 11.5–15.5)
WBC: 17.9 10*3/uL — AB (ref 4.0–10.5)

## 2016-12-04 LAB — COMPREHENSIVE METABOLIC PANEL
ALK PHOS: 143 U/L — AB (ref 38–126)
ALT: 33 U/L (ref 17–63)
AST: 23 U/L (ref 15–41)
Albumin: 2.2 g/dL — ABNORMAL LOW (ref 3.5–5.0)
Anion gap: 11 (ref 5–15)
BILIRUBIN TOTAL: 0.5 mg/dL (ref 0.3–1.2)
BUN: 5 mg/dL — AB (ref 6–20)
CALCIUM: 8.1 mg/dL — AB (ref 8.9–10.3)
CHLORIDE: 100 mmol/L — AB (ref 101–111)
CO2: 26 mmol/L (ref 22–32)
CREATININE: 0.86 mg/dL (ref 0.61–1.24)
Glucose, Bld: 100 mg/dL — ABNORMAL HIGH (ref 65–99)
Potassium: 3.7 mmol/L (ref 3.5–5.1)
Sodium: 137 mmol/L (ref 135–145)
TOTAL PROTEIN: 5.7 g/dL — AB (ref 6.5–8.1)

## 2016-12-04 MED ORDER — CEFAZOLIN SODIUM-DEXTROSE 2-4 GM/100ML-% IV SOLN
2.0000 g | Freq: Three times a day (TID) | INTRAVENOUS | Status: DC
  Administered 2016-12-05 – 2016-12-10 (×14): 2 g via INTRAVENOUS
  Filled 2016-12-04 (×18): qty 100

## 2016-12-04 MED ORDER — OXYCODONE HCL 5 MG PO TABS
15.0000 mg | ORAL_TABLET | ORAL | Status: DC | PRN
Start: 2016-12-04 — End: 2016-12-10
  Administered 2016-12-04: 15 mg via ORAL
  Administered 2016-12-04: 20 mg via ORAL
  Administered 2016-12-04 (×2): 15 mg via ORAL
  Administered 2016-12-05 (×2): 20 mg via ORAL
  Administered 2016-12-05 (×2): 15 mg via ORAL
  Administered 2016-12-06: 20 mg via ORAL
  Administered 2016-12-06 (×3): 30 mg via ORAL
  Administered 2016-12-06: 20 mg via ORAL
  Administered 2016-12-07 – 2016-12-10 (×13): 30 mg via ORAL
  Filled 2016-12-04 (×2): qty 6
  Filled 2016-12-04: qty 3
  Filled 2016-12-04: qty 4
  Filled 2016-12-04: qty 6
  Filled 2016-12-04: qty 4
  Filled 2016-12-04: qty 3
  Filled 2016-12-04 (×5): qty 6
  Filled 2016-12-04: qty 4
  Filled 2016-12-04 (×3): qty 6
  Filled 2016-12-04: qty 3
  Filled 2016-12-04: qty 6
  Filled 2016-12-04: qty 4
  Filled 2016-12-04: qty 3
  Filled 2016-12-04: qty 6
  Filled 2016-12-04: qty 3
  Filled 2016-12-04: qty 6
  Filled 2016-12-04: qty 4
  Filled 2016-12-04 (×3): qty 6

## 2016-12-04 MED ORDER — LORAZEPAM 2 MG/ML IJ SOLN: 2.0000 mg | Freq: Once | INTRAMUSCULAR | Status: DC

## 2016-12-04 MED ORDER — LORAZEPAM 1 MG PO TABS
2.0000 mg | ORAL_TABLET | ORAL | Status: DC
  Administered 2016-12-05: 1 mg via ORAL
  Administered 2016-12-05 – 2016-12-07 (×2): 2 mg via ORAL
  Filled 2016-12-04 (×3): qty 2

## 2016-12-04 MED ORDER — DIAZEPAM 5 MG PO TABS
10.0000 mg | ORAL_TABLET | Freq: Four times a day (QID) | ORAL | Status: DC | PRN
  Administered 2016-12-04 – 2016-12-10 (×16): 10 mg via ORAL
  Filled 2016-12-04 (×17): qty 2

## 2016-12-04 MED ORDER — CEFAZOLIN SODIUM-DEXTROSE 2-4 GM/100ML-% IV SOLN
2.0000 g | Freq: Three times a day (TID) | INTRAVENOUS | Status: DC
  Administered 2016-12-04: 2 g via INTRAVENOUS
  Filled 2016-12-04 (×2): qty 100

## 2016-12-04 MED ORDER — KETOROLAC TROMETHAMINE 30 MG/ML IJ SOLN
30.0000 mg | Freq: Three times a day (TID) | INTRAMUSCULAR | Status: DC | PRN
  Administered 2016-12-04 – 2016-12-06 (×6): 30 mg via INTRAVENOUS
  Filled 2016-12-04 (×6): qty 1

## 2016-12-04 NOTE — Progress Notes (Signed)
Physical Therapy Evaluation Patient Details Name: Larry Best MRN: [id number] DOB: 07/24/1986 Today's Date: 12/04/2016   History of Present Illness  30 yo male, s/p fall with persistent back pain.  Presented to ED with lumbar fluid accumulation, abcess, and infection.  Now post L4-S1 Laminectomies.  Clinical Impression  Patient presenting with severe pain despite pain management at this time, (multifactorial due to surgery, infection, and drug use history likely).  Patient reluctant to get up out of bed, but once up is able to perform well, with MOD assist overall.  Pain limiting function, makes patient anxious and impulsive.  Anticipate patient will be able to progress well once pain is addressed and managed better.  Will continue on therapy services acutely.    Follow Up Recommendations Home health PT;Supervision/Assistance - 24 hour (Once pain decreased.)    Equipment Recommendations  Other (comment) (TBA, walker acutely.)    Recommendations for Other Services       Precautions / Restrictions Precautions Precautions: Fall;Back Precaution Booklet Issued: Yes (comment) Precaution Comments: Provided handout, reviewed handout with girlfriend. Restrictions Weight Bearing Restrictions: No      Mobility  Bed Mobility Overal bed mobility: Needs Assistance Bed Mobility: Rolling;Sit to Sidelying Rolling: Min assist       Sit to sidelying: Min assist;+2 for physical assistance General bed mobility comments: Instruction for log roll technique with patient and girlfriend.  Transfers Overall transfer level: Needs assistance Equipment used: Rolling walker (2 wheeled);None Transfers: Sit to/from Stand;Lateral/Scoot Transfers Sit to Stand: Min assist        Lateral/Scoot Transfers: Min guard General transfer comment: Patient impulsive due to pain, cues to slow down and work on improved technique.  Ambulation/Gait Ambulation/Gait assistance: Min guard Ambulation Distance  (Feet): 50 Feet Assistive device: Rolling walker (2 wheeled) Gait Pattern/deviations: Decreased stride length;Antalgic;Step-through pattern        Stairs            Wheelchair Mobility    Modified Rankin (Stroke Patients Only)       Balance Overall balance assessment: Needs assistance   Sitting balance-Leahy Scale: Fair     Standing balance support: Single extremity supported Standing balance-Leahy Scale: Poor                               Pertinent Vitals/Pain Pain Assessment: 0-10 Pain Score: 9  Pain Location: Low back and headache Pain Descriptors / Indicators: Constant;Guarding;Grimacing;Sharp Pain Intervention(s): Premedicated before session;Monitored during session;Limited activity within patient's tolerance    Home Living Family/patient expects to be discharged to:: Private residence Living Arrangements: Non-relatives/Friends Available Help at Discharge: Friend(s);Available 24 hours/day Type of Home: House Home Access: Stairs to enter   Entergy CorporationEntrance Stairs-Number of Steps: 1          Prior Function Level of Independence: Independent         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[employer], cooking     Higher education careers adviserHand Dominance        Extremity/Trunk Assessment   Upper Extremity Assessment Upper Extremity Assessment: Defer to OT evaluation;Overall [facility]WFL for tasks assessed    Lower Extremity Assessment Lower Extremity Assessment: Overall WFL for tasks assessed;Generalized weakness       Communication   Communication: No difficulties  Cognition Arousal/Alertness: Awake/alert Behavior During Therapy: Restless;Anxious;Impulsive Overall Cognitive Status: Within Functional Limits for tasks assessed                 General Comments: Pain limiting function  General Comments      Exercises     Assessment/Plan    PT Assessment Patient needs continued PT services  PT Problem List Decreased strength;Decreased balance;Decreased activity  tolerance;Decreased mobility;Pain;Decreased knowledge of precautions          PT Treatment Interventions DME instruction;Gait training;Functional mobility training;Therapeutic exercise;Therapeutic activities;Balance training;Neuromuscular re-education;Patient/family education    PT Goals (Current goals can be found in the Care Plan section)  Acute Rehab PT Goals Patient Stated Goal: Stop hurting PT Goal Formulation: With patient Time For Goal Achievement: 12/18/16 Potential to Achieve Goals: Good    Frequency Min 5X/week   Barriers to discharge        Co-evaluation PT/OT/SLP Co-Evaluation/Treatment: Yes Reason for Co-Treatment: Complexity of the patient's impairments (multi-system involvement);Necessary to address cognition/behavior during functional activity;For patient/therapist safety PT goals addressed during session: Mobility/safety with mobility;Balance;Proper use of DME         End of Session Equipment Utilized During Treatment: Gait belt;Other (comment) (Spinal area drain line.) Activity Tolerance: Patient limited by pain Patient left: in bed;with call bell/phone within reach;with family/visitor present Nurse Communication: Mobility status;Patient requests pain meds;Precautions         Time: 1400-1420 PT Time Calculation (min) (ACUTE ONLY): 20 min   Charges:   PT Evaluation $PT Eval Moderate Complexity: 1 Procedure     PT G Codes:        [name] 12/04/2016, 3:10 PM

## 2016-12-04 NOTE — Progress Notes (Signed)
Patient c/o 9/10 back pain and severe h/a. Patients incision assessed, ice applied to incision. Patient and significant other informed of all pain medication orders and time each medication can be given again. RN encouraged patient to do deep breathing exercises. Patient hyperventilating and states "easier said than done." Patient repositioned supine and back to left side. Assigned RN and CN informed of patient status. Patient significant other updated.

## 2016-12-04 NOTE — Progress Notes (Signed)
Subjective: Patient reports Patient asleep but easily aroused without complaints spite the fact that he has been complaining about inadequate pain control 9. That is displayed receiving a milligram of Dilaudid every 2 hours 15 mg of oxycodone every 3 hours 10 mg of Valium every 6 hours and now 30 mg of Toradol IV. patient is asleep but when he is aroused he complains of pain I think his pain is well enough managed as it stands.  Objective: Vital signs in last 24 hours: Temp:  [97 F (36.1 C)-99 F (37.2 C)] 99 F (37.2 C) (12/23 0500) Pulse Rate:  [76-118] 88 (12/23 0618) Resp:  [16-24] 18 (12/23 0500) BP: (125-162)/(62-94) 125/62 (12/23 0618) SpO2:  [90 %-100 %] 99 % (12/23 0500) Weight:  [100.7 kg (222 lb)] 100.7 kg (222 lb) (12/22 1445)  Intake/Output from previous day: 12/22 0701 - 12/23 0700 In: 2650 [I.V.:2650] Out: 1265 [Urine:1050; Drains:65; Blood:150] Intake/Output this shift: No intake/output data recorded.  Strength 5 out of 5 wound clean dry and intact  Lab Results:  Recent Labs  12/03/16 1553 12/04/16 0459  WBC 18.5* 17.9*  HGB 11.8* 11.3*  HCT 35.6* 33.9*  PLT 229 228   BMET  Recent Labs  12/03/16 1553 12/04/16 0459  NA 136 137  K 3.5 3.7  CL 102 100*  CO2 25 26  GLUCOSE 91 100*  BUN 9 5*  CREATININE 0.76 0.86  CALCIUM 8.2* 8.1*    Studies/Results: Mr Lumbar Spine Wo Contrast  Result Date: 12/03/2016 CLINICAL DATA:  Severe low back and bilateral leg pain since the patient fell in the bathtub 11/27/2016. EXAM: MRI LUMBAR SPINE WITHOUT CONTRAST TECHNIQUE: Multiplanar, multisequence MR imaging of the lumbar spine was performed. No intravenous contrast was administered. COMPARISON:  Plain films lumbar spine 11/28/2016. FINDINGS: Segmentation:  Standard. Alignment:  Maintained. Vertebrae: No fracture. There is some marrow edema about the left and right L4-5 facet joints. Conus medullaris: Extends to the L1 level and appears normal. Paraspinal and other  soft tissues: Extensive edema is seen in paraspinous musculature from the L1-2 level into the sacral segments. Edematous change is worst from mid L3 to S1-2. Focal fluid collections in paraspinous musculature are seen. On the left, the collection is centered at the level of the L5-S1 facet joints and measures 1.4 cm transverse by 2.1 cm by 5 cm craniocaudal. Collection on the right is centered at the level of the L5 pedicles measuring 1.6 cm transverse by 1.7 cm AP by 3.5 cm craniocaudal. Edema is also seen in the posterior aspect of the left psoas muscle. Disc levels: A posterior epidural collection is seen extending from approximately mid L4 to approximately S2. The collection measures up to 1.1 cm transverse by 0.9 cm AP by 7.8 cm craniocaudal. The collection causes marked compression of the thecal sac appearing worst from at L4-5 to the S1-2 level. The entire extent of the collection is not imaged in the axial plane. Intervertebral discs appear normal from T12-L1 to L4-5. Broad-based central protrusion at L5-S1 is identified. The foramina are open at this level. IMPRESSION: Extensive edema posterior paraspinous muscles with focal fluid collections most worrisome for pyomyositis. Epidural fluid collection as described above is most consistent with epidural abscess and causes marked compression of the thecal sac from L4-5 into the sacral segments. Given history of fall, hematoma is possible but highly unlikely. Marrow edema in the L4-5 facet joints worrisome for osteomyelitis. Broad-based central protrusion L5-S1 contributes to central canal stenosis in conjunction with epidural  fluid. Critical Value/emergent results were called by telephone at the time of interpretation on 12/03/2016 at 1:42 pm to Dr. Deatra JamesVYVYAN SUN , who verbally acknowledged these results. Electronically Signed   By: Drusilla Kannerhomas  Dalessio M.D.   On: 12/03/2016 13:46   Dg Lumbar Spine 1 View  Result Date: 12/03/2016 CLINICAL DATA:  Lumbar  laminectomy for epidural abscess L4-S1 EXAM: LUMBAR SPINE - 1 VIEW COMPARISON:  Cross-table lateral portable intraoperative image 190 7 hours compared to MRI lumbar spine 12/03/2016. FINDINGS: MR labeled with 5 lumbar vertebra. Metallic probe via dorsal approach projects dorsal to the mid L5 level. Tissue spreader and sponge project dorsal to the spine at the L4-S1 levels. Vertebral body and disc space heights maintained. IMPRESSION: Dorsal localization of the mid L5 level. Electronically Signed   By: Ulyses SouthwardMark  Boles M.D.   On: 12/03/2016 20:39    Assessment/Plan: 30 year old with a lumbar epidural , paraspinal and intrinsic spinal epidural abscess status post L4-S1 decompressive laminectomy. Cultures appear to be consistent with staph or strep with gram-positive cocci in pairs. Patient is on vancomycin and Ancef patient will need ID consult will need long-term IV and get mobilized with physical and occupational therapy.  LOS: 1 day     Mccartney Chuba P 12/04/2016, 7:54 AM

## 2016-12-04 NOTE — Progress Notes (Addendum)
PHARMACY - PHYSICIAN COMMUNICATION CRITICAL VALUE ALERT - BLOOD CULTURE IDENTIFICATION (BCID)  Results for orders placed or performed during the hospital encounter of 12/03/16  Blood Culture ID Panel (Reflexed) (Collected: 12/03/2016  3:56 PM)  Result Value Ref Range   Enterococcus species NOT DETECTED NOT DETECTED   Listeria monocytogenes NOT DETECTED NOT DETECTED   Staphylococcus species DETECTED (A) NOT DETECTED   Staphylococcus aureus DETECTED (A) NOT DETECTED   Methicillin resistance NOT DETECTED NOT DETECTED   Streptococcus species NOT DETECTED NOT DETECTED   Streptococcus agalactiae NOT DETECTED NOT DETECTED   Streptococcus pneumoniae NOT DETECTED NOT DETECTED   Streptococcus pyogenes NOT DETECTED NOT DETECTED   Acinetobacter baumannii NOT DETECTED NOT DETECTED   Enterobacteriaceae species NOT DETECTED NOT DETECTED   Enterobacter cloacae complex NOT DETECTED NOT DETECTED   Escherichia coli NOT DETECTED NOT DETECTED   Klebsiella oxytoca NOT DETECTED NOT DETECTED   Klebsiella pneumoniae NOT DETECTED NOT DETECTED   Proteus species NOT DETECTED NOT DETECTED   Serratia marcescens NOT DETECTED NOT DETECTED   Haemophilus influenzae NOT DETECTED NOT DETECTED   Neisseria meningitidis NOT DETECTED NOT DETECTED   Pseudomonas aeruginosa NOT DETECTED NOT DETECTED   Candida albicans NOT DETECTED NOT DETECTED   Candida glabrata NOT DETECTED NOT DETECTED   Candida krusei NOT DETECTED NOT DETECTED   Candida parapsilosis NOT DETECTED NOT DETECTED   Candida tropicalis NOT DETECTED NOT DETECTED    Name of physician (or Provider) Contacted: Dr. Blake DivineAkula  Changes to prescribed antibiotics required: Pt on vancomycin and cefazolin. 4/4 BCxrs with GPC, BCID with MSSA. Recommended D/c vancomycin and continue cefazolin - dose OK, authorized by Dr. Blake DivineAkula.   Allena Katzaroline E Amadu Schlageter, Pharm.D. PGY1 Pharmacy Resident 12/23/20179:34 AM Pager [phone]

## 2016-12-04 NOTE — Consult Note (Signed)
Date of Admission:  12/03/2016  Date of Consult:  12/04/2016  Reason for Consult: MSSA bacteremia and epidural abscess Referring Physician: "Terrilyn Saver auto consult" and Dr. Karleen Hampshire   HPI: Larry Best is an 30 y.o. male IV drug user who had been in prison up until roughly a month ago. He has since been living with a husband and wife who care for IV drug users and help them detox from this condition. [place] the room and began talking the patient there was a person who mentioned he was the patient's friend and as we went to further discussions about the patient with his permission and it was revealed that the patient was in IV drug user having used heroin last before incarceration by his story, the friend then told me that patient was living with him and his wife as part of a drug rehabilitation program.  Regardless Larry Best came to the hospital with fear unrelenting back pain that had failed to improve after he had suffered a mechanical fall in the shower earlier in December. He again having leg weakness and underwent MRI which showed an epidural fluid collection in the L4-L5 region with market compression on the thecal sac. Blood cultures were drawn the patient was started on antibiotics and taken to the operating room for urgent lumbar laminectomy and drainage of his epidural abscess.  Admission blood cultures have subsequently grown methicillin sensitive Staphylococcus aureus. Intraoperative cultures seem to be growing Staphylococcus aureus as well.  Sides his continuing back pain the patient's main other symptom is a headache.   Past Medical History:  Diagnosis Date  . IVDU (intravenous drug user) 12/04/2016  . MSSA (methicillin susceptible Staphylococcus aureus) infection 12/04/2016  . Staphylococcus aureus bacteremia 12/04/2016    Past Surgical History:  Procedure Laterality Date  . LUMBAR LAMINECTOMY FOR EPIDURAL ABSCESS N/A 12/03/2016   Procedure: DECOMPRESSIVE  LUMBAR LAMINECTOMY FOR EPIDURAL ABSCESS LUMBAR FOUR- SACRAL ONE;  Surgeon: Kary Kos, MD;  Location: [facility];  Service: Neurosurgery;  Laterality: N/A;    Social History:  reports that he has been smoking Cigarettes.  He has been smoking about 0.50 packs per day. He does not have any smokeless tobacco history on file. He reports that he drinks alcohol. He reports that he does not use drugs.   No family history on file.  No Known Allergies   Medications: I have reviewed patients current medications as documented in Epic Anti-infectives    Start     Dose/Rate Route Frequency Ordered Stop   12/04/16 0300  vancomycin (VANCOCIN) 1,250 mg in sodium chloride 0.9 % 250 mL IVPB  Status:  Discontinued     1,250 mg 166.7 mL/hr over 90 Minutes Intravenous Every 8 hours 12/03/16 2330 12/04/16 0934   12/03/16 2359  ceFAZolin (ANCEF) IVPB 2g/100 mL premix     2 g 200 mL/hr over 30 Minutes Intravenous Every 8 hours 12/03/16 2245 12/04/16 0958   12/03/16 1946  vancomycin (VANCOCIN) powder  Status:  Discontinued       As needed 12/03/16 1947 12/03/16 2043   12/03/16 1855  50,000 units bacitracin in 0.9% normal saline 250 mL irrigation  Status:  Discontinued       As needed 12/03/16 1855 12/03/16 2043         ROS:  as in HPI otherwise remainder of 12 point Review of Systems is negative   Blood pressure 126/63, pulse 94, temperature 98.8 F (37.1 C), temperature source Oral, resp.  rate 16, height '5\' 9"'$  (1.753 m), weight 222 lb (100.7 kg), SpO2 92 %. General: Alert and awake, oriented x3, and planning of a headache and close his eyes frequently. HEENT: anicteric sclera,  EOMI, oropharynx clear and without exudate Cardiovascular: regular rate, normal r,  no murmur rubs or gallops Pulmonary: clear to auscultation bilaterally, no wheezing, rales or rhonchi Gastrointestinal: soft nontender, nondistended, normal bowel sounds, Musculoskeletal: no  clubbing or edema noted bilaterally Skin, soft tissue: no  rashes Neuro: no new focal deficits   Results for orders placed or performed during the hospital encounter of 12/03/16 (from the past 48 hour(s))  Blood culture (routine x 2)     Status: None (Preliminary result)   Collection Time: 12/03/16  3:48 PM  Result Value Ref Range   Specimen Description BLOOD RIGHT ANTECUBITAL    Special Requests BOTTLES DRAWN AEROBIC AND ANAEROBIC 5CC    Culture  Setup Time      GRAM POSITIVE COCCI IN CLUSTERS IN BOTH AEROBIC AND ANAEROBIC BOTTLES    Culture PENDING    Report Status PENDING   CBC with Differential     Status: Abnormal   Collection Time: 12/03/16  3:53 PM  Result Value Ref Range   WBC 18.5 (H) 4.0 - 10.5 K/uL   RBC 4.02 (L) 4.22 - 5.81 MIL/uL   Hemoglobin 11.8 (L) 13.0 - 17.0 g/dL   HCT 35.6 (L) 39.0 - 52.0 %   MCV 88.6 78.0 - 100.0 fL   MCH 29.4 26.0 - 34.0 pg   MCHC 33.1 30.0 - 36.0 g/dL   RDW 13.8 11.5 - 15.5 %   Platelets 229 150 - 400 K/uL   Neutrophils Relative % 75 %   Neutro Abs 13.8 (H) 1.7 - 7.7 K/uL   Lymphocytes Relative 12 %   Lymphs Abs 2.2 0.7 - 4.0 K/uL   Monocytes Relative 12 %   Monocytes Absolute 2.2 (H) 0.1 - 1.0 K/uL   Eosinophils Relative 1 %   Eosinophils Absolute 0.3 0.0 - 0.7 K/uL   Basophils Relative 0 %   Basophils Absolute 0.1 0.0 - 0.1 K/uL  Basic metabolic panel     Status: Abnormal   Collection Time: 12/03/16  3:53 PM  Result Value Ref Range   Sodium 136 135 - 145 mmol/L   Potassium 3.5 3.5 - 5.1 mmol/L   Chloride 102 101 - 111 mmol/L   CO2 25 22 - 32 mmol/L   Glucose, Bld 91 65 - 99 mg/dL   BUN 9 6 - 20 mg/dL   Creatinine, Ser 0.76 0.61 - 1.24 mg/dL   Calcium 8.2 (L) 8.9 - 10.3 mg/dL   GFR calc non Af Amer >60 >60 mL/min   GFR calc Af Amer >60 >60 mL/min    Comment: (NOTE) The eGFR has been calculated using the CKD EPI equation. This calculation has not been validated in all clinical situations. eGFR's persistently <60 mL/min signify possible Chronic Kidney Disease.    Anion gap 9 5 -  15  Sedimentation rate     Status: Abnormal   Collection Time: 12/03/16  3:53 PM  Result Value Ref Range   Sed Rate 56 (H) 0 - 16 mm/hr  C-reactive protein     Status: Abnormal   Collection Time: 12/03/16  3:53 PM  Result Value Ref Range   CRP 27.4 (H) <1.0 mg/dL  Protime-INR     Status: None   Collection Time: 12/03/16  3:53 PM  Result Value Ref Range  Prothrombin Time 13.0 11.4 - 15.2 seconds   INR 0.98   APTT     Status: Abnormal   Collection Time: 12/03/16  3:53 PM  Result Value Ref Range   aPTT 37 (H) 24 - 36 seconds    Comment:        IF BASELINE aPTT IS ELEVATED, SUGGEST PATIENT RISK ASSESSMENT BE USED TO DETERMINE APPROPRIATE ANTICOAGULANT THERAPY.   Blood culture (routine x 2)     Status: None (Preliminary result)   Collection Time: 12/03/16  3:56 PM  Result Value Ref Range   Specimen Description BLOOD RIGHT HAND    Special Requests BOTTLES DRAWN AEROBIC AND ANAEROBIC 5CC    Culture  Setup Time      GRAM POSITIVE COCCI IN CLUSTERS IN BOTH AEROBIC AND ANAEROBIC BOTTLES Organism ID to follow CRITICAL RESULT CALLED TO, READ BACK BY AND VERIFIED WITH: TONYA JOHNSON,PHARMD '@0724'$  12/04/16 MKELLY,MLT    Culture PENDING    Report Status PENDING   Blood Culture ID Panel (Reflexed)     Status: Abnormal   Collection Time: 12/03/16  3:56 PM  Result Value Ref Range   Enterococcus species NOT DETECTED NOT DETECTED   Listeria monocytogenes NOT DETECTED NOT DETECTED   Staphylococcus species DETECTED (A) NOT DETECTED    Comment: CRITICAL RESULT CALLED TO, READ BACK BY AND VERIFIED WITH: Evon Slack, PHARMD '@0724'$  12/04/16 MKELLY,MLT    Staphylococcus aureus DETECTED (A) NOT DETECTED    Comment: CRITICAL RESULT CALLED TO, READ BACK BY AND VERIFIED WITH: Evon Slack, PHARMD '@0724'$  12/04/16 MKELLY,MLT    Methicillin resistance NOT DETECTED NOT DETECTED   Streptococcus species NOT DETECTED NOT DETECTED   Streptococcus agalactiae NOT DETECTED NOT DETECTED   Streptococcus  pneumoniae NOT DETECTED NOT DETECTED   Streptococcus pyogenes NOT DETECTED NOT DETECTED   Acinetobacter baumannii NOT DETECTED NOT DETECTED   Enterobacteriaceae species NOT DETECTED NOT DETECTED   Enterobacter cloacae complex NOT DETECTED NOT DETECTED   Escherichia coli NOT DETECTED NOT DETECTED   Klebsiella oxytoca NOT DETECTED NOT DETECTED   Klebsiella pneumoniae NOT DETECTED NOT DETECTED   Proteus species NOT DETECTED NOT DETECTED   Serratia marcescens NOT DETECTED NOT DETECTED   Haemophilus influenzae NOT DETECTED NOT DETECTED   Neisseria meningitidis NOT DETECTED NOT DETECTED   Pseudomonas aeruginosa NOT DETECTED NOT DETECTED   Candida albicans NOT DETECTED NOT DETECTED   Candida glabrata NOT DETECTED NOT DETECTED   Candida krusei NOT DETECTED NOT DETECTED   Candida parapsilosis NOT DETECTED NOT DETECTED   Candida tropicalis NOT DETECTED NOT DETECTED  Type and screen [place]     Status: None   Collection Time: 12/03/16  3:59 PM  Result Value Ref Range   ABO/RH(D) A POS    Antibody Screen NEG    Sample Expiration 12/06/2016   ABO/Rh     Status: None   Collection Time: 12/03/16  3:59 PM  Result Value Ref Range   ABO/RH(D) A POS   I-Stat CG4 Lactic Acid, ED     Status: None   Collection Time: 12/03/16  4:29 PM  Result Value Ref Range   Lactic Acid, Venous 1.18 0.5 - 1.9 mmol/L  Aerobic/Anaerobic Culture (surgical/deep wound)     Status: None (Preliminary result)   Collection Time: 12/03/16  7:14 PM  Result Value Ref Range   Specimen Description WOUND    Special Requests SWAB OF LEFT PARASPINAL PT ON ANCEF VANC SAMPLE A    Gram Stain  RARE WBC PRESENT,BOTH PMN AND MONONUCLEAR FEW GRAM POSITIVE COCCI IN CLUSTERS    Culture      ABUNDANT STAPHYLOCOCCUS AUREUS CULTURE REINCUBATED FOR BETTER GROWTH    Report Status PENDING   Aerobic/Anaerobic Culture (surgical/deep wound)     Status: None (Preliminary result)   Collection Time: 12/03/16  7:16 PM    Result Value Ref Range   Specimen Description WOUND    Special Requests LEFT PARASPINAL SWAB PT ON VANC ANCEF SAMPLE B    Gram Stain      MODERATE WBC PRESENT,BOTH PMN AND MONONUCLEAR MODERATE GRAM POSITIVE COCCI IN CLUSTERS    Culture      ABUNDANT STAPHYLOCOCCUS AUREUS CULTURE REINCUBATED FOR BETTER GROWTH    Report Status PENDING   Aerobic/Anaerobic Culture (surgical/deep wound)     Status: None (Preliminary result)   Collection Time: 12/03/16  7:21 PM  Result Value Ref Range   Specimen Description WOUND    Special Requests LEFT LUMBAR FACET 4,5 PT ON VANC ANCEF SAMPLE C    Gram Stain      FEW WBC PRESENT, PREDOMINANTLY PMN RARE GRAM POSITIVE COCCI IN PAIRS    Culture      FEW STAPHYLOCOCCUS AUREUS CULTURE REINCUBATED FOR BETTER GROWTH    Report Status PENDING   Aerobic/Anaerobic Culture (surgical/deep wound)     Status: None (Preliminary result)   Collection Time: 12/03/16  7:28 PM  Result Value Ref Range   Specimen Description WOUND    Special Requests      LEFT LUMBAR FOUR EPIDURAL SPACE PT ON VANC ANCEF SAMPLE D   Gram Stain      RARE WBC PRESENT,BOTH PMN AND MONONUCLEAR RARE GRAM POSITIVE COCCI IN CLUSTERS    Culture      ABUNDANT STAPHYLOCOCCUS AUREUS CULTURE REINCUBATED FOR BETTER GROWTH    Report Status PENDING   CBC with Differential/Platelet     Status: Abnormal   Collection Time: 12/04/16  4:59 AM  Result Value Ref Range   WBC 17.9 (H) 4.0 - 10.5 K/uL   RBC 3.82 (L) 4.22 - 5.81 MIL/uL   Hemoglobin 11.3 (L) 13.0 - 17.0 g/dL   HCT 33.9 (L) 39.0 - 52.0 %   MCV 88.7 78.0 - 100.0 fL   MCH 29.6 26.0 - 34.0 pg   MCHC 33.3 30.0 - 36.0 g/dL   RDW 14.2 11.5 - 15.5 %   Platelets 228 150 - 400 K/uL   Neutrophils Relative % 71 %   Neutro Abs 12.7 (H) 1.7 - 7.7 K/uL   Lymphocytes Relative 17 %   Lymphs Abs 3.0 0.7 - 4.0 K/uL   Monocytes Relative 11 %   Monocytes Absolute 2.0 (H) 0.1 - 1.0 K/uL   Eosinophils Relative 1 %   Eosinophils Absolute 0.2 0.0 -  0.7 K/uL   Basophils Relative 0 %   Basophils Absolute 0.1 0.0 - 0.1 K/uL  Comprehensive metabolic panel     Status: Abnormal   Collection Time: 12/04/16  4:59 AM  Result Value Ref Range   Sodium 137 135 - 145 mmol/L   Potassium 3.7 3.5 - 5.1 mmol/L   Chloride 100 (L) 101 - 111 mmol/L   CO2 26 22 - 32 mmol/L   Glucose, Bld 100 (H) 65 - 99 mg/dL   BUN 5 (L) 6 - 20 mg/dL   Creatinine, Ser 0.86 0.61 - 1.24 mg/dL   Calcium 8.1 (L) 8.9 - 10.3 mg/dL   Total Protein 5.7 (L) 6.5 - 8.1 g/dL  Albumin 2.2 (L) 3.5 - 5.0 g/dL   AST 23 15 - 41 U/L   ALT 33 17 - 63 U/L   Alkaline Phosphatase 143 (H) 38 - 126 U/L   Total Bilirubin 0.5 0.3 - 1.2 mg/dL   GFR calc non Af Amer >60 >60 mL/min   GFR calc Af Amer >60 >60 mL/min    Comment: (NOTE) The eGFR has been calculated using the CKD EPI equation. This calculation has not been validated in all clinical situations. eGFR's persistently <60 mL/min signify possible Chronic Kidney Disease.    Anion gap 11 5 - 15   '@BRIEFLABTABLE'$ (sdes,specrequest,cult,reptstatus)   ) Recent Results (from the past 720 hour(s))  Blood culture (routine x 2)     Status: None (Preliminary result)   Collection Time: 12/03/16  3:48 PM  Result Value Ref Range Status   Specimen Description BLOOD RIGHT ANTECUBITAL  Final   Special Requests BOTTLES DRAWN AEROBIC AND ANAEROBIC 5CC  Final   Culture  Setup Time   Final    GRAM POSITIVE COCCI IN CLUSTERS IN BOTH AEROBIC AND ANAEROBIC BOTTLES    Culture PENDING  Incomplete   Report Status PENDING  Incomplete  Blood culture (routine x 2)     Status: None (Preliminary result)   Collection Time: 12/03/16  3:56 PM  Result Value Ref Range Status   Specimen Description BLOOD RIGHT HAND  Final   Special Requests BOTTLES DRAWN AEROBIC AND ANAEROBIC 5CC  Final   Culture  Setup Time   Final    GRAM POSITIVE COCCI IN CLUSTERS IN BOTH AEROBIC AND ANAEROBIC BOTTLES Organism ID to follow CRITICAL RESULT CALLED TO, READ BACK BY AND  VERIFIED WITH: TONYA JOHNSON,PHARMD '@0724'$  12/04/16 MKELLY,MLT    Culture PENDING  Incomplete   Report Status PENDING  Incomplete  Blood Culture ID Panel (Reflexed)     Status: Abnormal   Collection Time: 12/03/16  3:56 PM  Result Value Ref Range Status   Enterococcus species NOT DETECTED NOT DETECTED Final   Listeria monocytogenes NOT DETECTED NOT DETECTED Final   Staphylococcus species DETECTED (A) NOT DETECTED Final    Comment: CRITICAL RESULT CALLED TO, READ BACK BY AND VERIFIED WITH: Evon Slack, PHARMD '@0724'$  12/04/16 MKELLY,MLT    Staphylococcus aureus DETECTED (A) NOT DETECTED Final    Comment: CRITICAL RESULT CALLED TO, READ BACK BY AND VERIFIED WITH: Evon Slack, PHARMD '@0724'$  12/04/16 MKELLY,MLT    Methicillin resistance NOT DETECTED NOT DETECTED Final   Streptococcus species NOT DETECTED NOT DETECTED Final   Streptococcus agalactiae NOT DETECTED NOT DETECTED Final   Streptococcus pneumoniae NOT DETECTED NOT DETECTED Final   Streptococcus pyogenes NOT DETECTED NOT DETECTED Final   Acinetobacter baumannii NOT DETECTED NOT DETECTED Final   Enterobacteriaceae species NOT DETECTED NOT DETECTED Final   Enterobacter cloacae complex NOT DETECTED NOT DETECTED Final   Escherichia coli NOT DETECTED NOT DETECTED Final   Klebsiella oxytoca NOT DETECTED NOT DETECTED Final   Klebsiella pneumoniae NOT DETECTED NOT DETECTED Final   Proteus species NOT DETECTED NOT DETECTED Final   Serratia marcescens NOT DETECTED NOT DETECTED Final   Haemophilus influenzae NOT DETECTED NOT DETECTED Final   Neisseria meningitidis NOT DETECTED NOT DETECTED Final   Pseudomonas aeruginosa NOT DETECTED NOT DETECTED Final   Candida albicans NOT DETECTED NOT DETECTED Final   Candida glabrata NOT DETECTED NOT DETECTED Final   Candida krusei NOT DETECTED NOT DETECTED Final   Candida parapsilosis NOT DETECTED NOT DETECTED Final   Candida tropicalis NOT DETECTED  NOT DETECTED Final  Aerobic/Anaerobic Culture  (surgical/deep wound)     Status: None (Preliminary result)   Collection Time: 12/03/16  7:14 PM  Result Value Ref Range Status   Specimen Description WOUND  Final   Special Requests SWAB OF LEFT PARASPINAL PT ON ANCEF VANC SAMPLE A  Final   Gram Stain   Final    RARE WBC PRESENT,BOTH PMN AND MONONUCLEAR FEW GRAM POSITIVE COCCI IN CLUSTERS    Culture   Final    ABUNDANT STAPHYLOCOCCUS AUREUS CULTURE REINCUBATED FOR BETTER GROWTH    Report Status PENDING  Incomplete  Aerobic/Anaerobic Culture (surgical/deep wound)     Status: None (Preliminary result)   Collection Time: 12/03/16  7:16 PM  Result Value Ref Range Status   Specimen Description WOUND  Final   Special Requests LEFT PARASPINAL SWAB PT ON VANC ANCEF SAMPLE B  Final   Gram Stain   Final    MODERATE WBC PRESENT,BOTH PMN AND MONONUCLEAR MODERATE GRAM POSITIVE COCCI IN CLUSTERS    Culture   Final    ABUNDANT STAPHYLOCOCCUS AUREUS CULTURE REINCUBATED FOR BETTER GROWTH    Report Status PENDING  Incomplete  Aerobic/Anaerobic Culture (surgical/deep wound)     Status: None (Preliminary result)   Collection Time: 12/03/16  7:21 PM  Result Value Ref Range Status   Specimen Description WOUND  Final   Special Requests LEFT LUMBAR FACET 4,5 PT ON VANC ANCEF SAMPLE C  Final   Gram Stain   Final    FEW WBC PRESENT, PREDOMINANTLY PMN RARE GRAM POSITIVE COCCI IN PAIRS    Culture   Final    FEW STAPHYLOCOCCUS AUREUS CULTURE REINCUBATED FOR BETTER GROWTH    Report Status PENDING  Incomplete  Aerobic/Anaerobic Culture (surgical/deep wound)     Status: None (Preliminary result)   Collection Time: 12/03/16  7:28 PM  Result Value Ref Range Status   Specimen Description WOUND  Final   Special Requests   Final    LEFT LUMBAR FOUR EPIDURAL SPACE PT ON VANC ANCEF SAMPLE D   Gram Stain   Final    RARE WBC PRESENT,BOTH PMN AND MONONUCLEAR RARE GRAM POSITIVE COCCI IN CLUSTERS    Culture   Final    ABUNDANT STAPHYLOCOCCUS  AUREUS CULTURE REINCUBATED FOR BETTER GROWTH    Report Status PENDING  Incomplete     Impression/Recommendation  Principal Problem:   Spinal epidural abscess Active Problems:   Abscess in epidural space of lumbar spine   IVDU (intravenous drug user)   Staphylococcus aureus bacteremia   MSSA (methicillin susceptible Staphylococcus aureus) infection   Larry Best is a 30 y.o. male with  Hx of IVDU now with MSSA bacteremia and epidural abscess with compression, lumbar diskitis, vertebral osteomyelitis at his post laminectomy and drainage of epidural abscess.  #1 Methicillin sensitive staphylococcus aureus bacteremia with epidural abscess and vertebral osteomyelitis in IV drug user       [facility] Staphylococcus aureus bacteremia   Staphylococcus aureus bacteremia (SAB) is associated with a high rate of complications and mortality.  Specific aspects of clinical management are critical to optimizing the outcome of patients with SAB.  Therefore, the [facility] Antimicrobial Management Team [facility]) has initiated an intervention aimed at improving the management of SAB at [facility].  To do so, Infectious Diseases physicians are providing an evidence-based consult for the management of all patients with SAB.     Yes No Comments  Perform follow-up blood cultures (even if  the patient is afebrile) to ensure clearance of bacteremia '[x]'$  '[]'$  Repeating blood cultures today   Remove vascular catheter and obtain follow-up blood cultures after the removal of the catheter '[]'$  '[]'$  DO NOT PLACE PICC UNTIL WE HAVE PROVEN STERILITY OF BLOOD CULTURES AT DAY #5  Perform echocardiography to evaluate for endocarditis (transthoracic ECHO is 40-50% sensitive, TEE is > 90% sensitive) '[x]'$  '[]'$  Please keep in mind, that neither test can definitively EXCLUDE endocarditis, and that should clinical suspicion remain high for endocarditis the patient should then still be treated with an  "endocarditis" duration of therapy = 6 weeks  Consult electrophysiologist to evaluate implanted cardiac device (pacemaker, ICD) '[]'$  '[]'$  na  Ensure source control '[]'$  '[]'$  Have all abscesses been drained effectively? Have deep seeded infections (septic joints or osteomyelitis) had appropriate surgical debridement?  He has undergone neurosurgery   Investigate for "metastatic" sites of infection '[]'$  '[]'$  Does the patient have ANY symptom or physical exam finding that would suggest a deeper infection (back or neck pain that may be suggestive of vertebral osteomyelitis or epidural abscess, muscle pain that could be a symptom of pyomyositis)?  Keep in mind that for deep seeded infections MRI imaging with contrast is preferred rather than other often insensitive tests such as plain x-rays, especially early in a patient's presentation.  MRI brain with contrast  Change antibiotic therapy to Cefazolin. If he has evidence of brain infection or left sided endocarditis will need to change to a brain penetrating antibiotic such as nafcillin or ceftriaxone.  '[]'$  '[]'$  Beta-lactam antibiotics are preferred for MSSA due to higher cure rates.   If on Vancomycin, goal trough should be 15 - 20 mcg/mL  Estimated duration of IV antibiotic therapy:  6-8 weeks '[]'$  '[]'$  Consult case management for probably prolonged outpatient IV antibiotic therapy    #2 IVDU: We'll check for HIV hep C hep B. He is in a home for rehabilitation hopefully this is a Architect. The gentleman who told me about it was at the bedside said that he and his wife are been practicing drug rehabilitation for 20 years with a 73% success rate.   12/04/2016, 12:46 PM   Thank you so much for this interesting consult  [place] [facility] [place] [phone] (pager) [phone] (office) 12/04/2016, 12:46 PM  Rhina [place] 12/04/2016, 12:46 PM

## 2016-12-04 NOTE — Progress Notes (Signed)
PROGRESS NOTE    Larry Best  JYN:[id number] DOB: 1986-08-16 DOA: 12/03/2016 PCP: [facility]    Brief Narrative:  Larry Best is a 30 y.o. male with  Hx of IVDU , reports a mechanical fall a week ago, comes back with worsening back pain, associated with fevers and chills  Found to have with MSSA bacteremia and epidural abscess with compression, s/p laminectomies .  MRI shows  lumbar diskitis,  Possible vertebral osteomyelitis.   Today patient reports worsening headache. MRI brain ordered for further evaluation.  Assessment & Plan:   Principal Problem:   Epidural abscess Active Problems:   Abscess in epidural space of lumbar spine   IVDU (intravenous drug user)   Staphylococcus aureus bacteremia   MSSA (methicillin susceptible Staphylococcus aureus) infection   Headache   Pyomyositis   MSSA bacteremia , epidural abscess, vertebral osteomyelitis: Started on IV cefazolin. Continue to monitor. Appreciate ID and neurosurgery input.  Will need TEE at some point.  Pain control, robaxin.  PT/ot eval.  Mild normocytic anemia:  Stable.   Headache: MRI Brain ordered and pain control. No nausea, or vomiting.    h/o IVDU:     DVT prophylaxis: SCD'S Code Status: (Full) Family Communication: none at bedside.  Disposition Plan: pending further eval.    Consultants:  Infectious disease.  Neurosurgery.   Procedures:  : Bilateral and Complete laminectomies of L4, L5, and S1 with medial facetectomies at L4-5 L5-S1 and foraminotomies of the L4, L5, S1, and S2 nerve roots on 12/22   Antimicrobials: cefazolin. 12/22   Subjective: Reports severe headache.   Objective: Vitals:   12/04/16 0130 12/04/16 0500 12/04/16 0618 12/04/16 1000  BP: 139/74 133/68 125/62 126/63  Pulse: (!) 101 (!) 114 88 94  Resp: 19 18  16   Temp: 98.8 F (37.1 C) 99 F (37.2 C)  98.8 F (37.1 C)  TempSrc: Oral Oral  Oral  SpO2: 93% 99%  92%  Weight:        Height:        Intake/Output Summary (Last 24 hours) at 12/04/16 1532 Last data filed at 12/04/16 0908  Gross per 24 hour  Intake             2650 ml  Output             1890 ml  Net              760 ml   Filed Weights   12/03/16 1445  Weight: 100.7 kg (222 lb)    Examination:  General exam: anxious, in mod distress from headache.  Respiratory system: Clear to auscultation. Respiratory effort normal. Cardiovascular system: S1 & S2 heard, tachycardia.  No JVD, murmurs, rubs, gallops or clicks. No pedal edema. Gastrointestinal system: Abdomen is nondistended, soft and nontender. No organomegaly or masses felt. Normal bowel sounds heard. Central nervous system: Alert and oriented. Able to move all extremities. Speech normal.  Extremities: no pedal edema.  Skin: No rashes, lesions or ulcers     Data Reviewed: I have personally reviewed following labs and imaging studies  CBC:  Recent Labs Lab 11/28/16 1000 12/03/16 1553 12/04/16 0459  WBC  --  18.5* 17.9*  NEUTROABS  --  13.8* 12.7*  HGB 14.6 11.8* 11.3*  HCT 43.0 35.6* 33.9*  MCV  --  88.6 88.7  PLT  --  229 228   Basic Metabolic Panel:  Recent Labs Lab 11/28/16 1000 12/03/16 1553 12/04/16 0459  NA 137 136 137  K 4.6 3.5 3.7  CL 103 102 100*  CO2  --  25 26  GLUCOSE 94 91 100*  BUN 8 9 5*  CREATININE 0.90 0.76 0.86  CALCIUM  --  8.2* 8.1*   GFR: Estimated Creatinine Clearance: 148.3 mL/min (by C-G formula based on SCr of 0.86 mg/dL). Liver Function Tests:  Recent Labs Lab 12/04/16 0459  AST 23  ALT 33  ALKPHOS 143*  BILITOT 0.5  PROT 5.7*  ALBUMIN 2.2*   No results for input(s): LIPASE, AMYLASE in the last 168 hours. No results for input(s): AMMONIA in the last 168 hours. Coagulation Profile:  Recent Labs Lab 12/03/16 1553  INR 0.98   Cardiac Enzymes: No results for input(s): CKTOTAL, CKMB, CKMBINDEX, TROPONINI in the last 168 hours. BNP (last 3 results) No results for input(s):  PROBNP in the last 8760 hours. HbA1C: No results for input(s): HGBA1C in the last 72 hours. CBG: No results for input(s): GLUCAP in the last 168 hours. Lipid Profile: No results for input(s): CHOL, HDL, LDLCALC, TRIG, CHOLHDL, LDLDIRECT in the last 72 hours. Thyroid Function Tests: No results for input(s): TSH, T4TOTAL, FREET4, T3FREE, THYROIDAB in the last 72 hours. Anemia Panel: No results for input(s): VITAMINB12, FOLATE, FERRITIN, TIBC, IRON, RETICCTPCT in the last 72 hours. Sepsis Labs:  Recent Labs Lab 12/03/16 1629  LATICACIDVEN 1.18    Recent Results (from the past 240 hour(s))  Blood culture (routine x 2)     Status: None (Preliminary result)   Collection Time: 12/03/16  3:48 PM  Result Value Ref Range Status   Specimen Description BLOOD RIGHT ANTECUBITAL  Final   Special Requests BOTTLES DRAWN AEROBIC AND ANAEROBIC 5CC  Final   Culture  Setup Time   Final    GRAM POSITIVE COCCI IN CLUSTERS IN BOTH AEROBIC AND ANAEROBIC BOTTLES    Culture NO GROWTH < 24 HOURS  Final   Report Status PENDING  Incomplete  Blood culture (routine x 2)     Status: None (Preliminary result)   Collection Time: 12/03/16  3:56 PM  Result Value Ref Range Status   Specimen Description BLOOD RIGHT HAND  Final   Special Requests BOTTLES DRAWN AEROBIC AND ANAEROBIC 5CC  Final   Culture  Setup Time   Final    GRAM POSITIVE COCCI IN CLUSTERS IN BOTH AEROBIC AND ANAEROBIC BOTTLES Organism ID to follow CRITICAL RESULT CALLED TO, READ BACK BY AND VERIFIED WITH: TONYA JOHNSON,PHARMD @0724  12/04/16 MKELLY,MLT    Culture NO GROWTH < 24 HOURS  Final   Report Status PENDING  Incomplete  Blood Culture ID Panel (Reflexed)     Status: Abnormal   Collection Time: 12/03/16  3:56 PM  Result Value Ref Range Status   Enterococcus species NOT DETECTED NOT DETECTED Final   Listeria monocytogenes NOT DETECTED NOT DETECTED Final   Staphylococcus species DETECTED (A) NOT DETECTED Final    Comment: CRITICAL  RESULT CALLED TO, READ BACK BY AND VERIFIED WITH: Renard Hamper, PHARMD @0724  12/04/16 MKELLY,MLT    Staphylococcus aureus DETECTED (A) NOT DETECTED Final    Comment: CRITICAL RESULT CALLED TO, READ BACK BY AND VERIFIED WITH: Renard Hamper, PHARMD @0724  12/04/16 MKELLY,MLT    Methicillin resistance NOT DETECTED NOT DETECTED Final   Streptococcus species NOT DETECTED NOT DETECTED Final   Streptococcus agalactiae NOT DETECTED NOT DETECTED Final   Streptococcus pneumoniae NOT DETECTED NOT DETECTED Final   Streptococcus pyogenes NOT DETECTED NOT DETECTED Final   Acinetobacter baumannii  NOT DETECTED NOT DETECTED Final   Enterobacteriaceae species NOT DETECTED NOT DETECTED Final   Enterobacter cloacae complex NOT DETECTED NOT DETECTED Final   Escherichia coli NOT DETECTED NOT DETECTED Final   Klebsiella oxytoca NOT DETECTED NOT DETECTED Final   Klebsiella pneumoniae NOT DETECTED NOT DETECTED Final   Proteus species NOT DETECTED NOT DETECTED Final   Serratia marcescens NOT DETECTED NOT DETECTED Final   Haemophilus influenzae NOT DETECTED NOT DETECTED Final   Neisseria meningitidis NOT DETECTED NOT DETECTED Final   Pseudomonas aeruginosa NOT DETECTED NOT DETECTED Final   Candida albicans NOT DETECTED NOT DETECTED Final   Candida glabrata NOT DETECTED NOT DETECTED Final   Candida krusei NOT DETECTED NOT DETECTED Final   Candida parapsilosis NOT DETECTED NOT DETECTED Final   Candida tropicalis NOT DETECTED NOT DETECTED Final  Aerobic/Anaerobic Culture (surgical/deep wound)     Status: None (Preliminary result)   Collection Time: 12/03/16  7:14 PM  Result Value Ref Range Status   Specimen Description WOUND  Final   Special Requests SWAB OF LEFT PARASPINAL PT ON ANCEF VANC SAMPLE A  Final   Gram Stain   Final    RARE WBC PRESENT,BOTH PMN AND MONONUCLEAR FEW GRAM POSITIVE COCCI IN CLUSTERS    Culture   Final    ABUNDANT STAPHYLOCOCCUS AUREUS CULTURE REINCUBATED FOR BETTER GROWTH    Report  Status PENDING  Incomplete  Aerobic/Anaerobic Culture (surgical/deep wound)     Status: None (Preliminary result)   Collection Time: 12/03/16  7:16 PM  Result Value Ref Range Status   Specimen Description WOUND  Final   Special Requests LEFT PARASPINAL SWAB PT ON VANC ANCEF SAMPLE B  Final   Gram Stain   Final    MODERATE WBC PRESENT,BOTH PMN AND MONONUCLEAR MODERATE GRAM POSITIVE COCCI IN CLUSTERS    Culture   Final    ABUNDANT STAPHYLOCOCCUS AUREUS CULTURE REINCUBATED FOR BETTER GROWTH    Report Status PENDING  Incomplete  Aerobic/Anaerobic Culture (surgical/deep wound)     Status: None (Preliminary result)   Collection Time: 12/03/16  7:21 PM  Result Value Ref Range Status   Specimen Description WOUND  Final   Special Requests LEFT LUMBAR FACET 4,5 PT ON VANC ANCEF SAMPLE C  Final   Gram Stain   Final    FEW WBC PRESENT, PREDOMINANTLY PMN RARE GRAM POSITIVE COCCI IN PAIRS    Culture   Final    FEW STAPHYLOCOCCUS AUREUS CULTURE REINCUBATED FOR BETTER GROWTH    Report Status PENDING  Incomplete  Aerobic/Anaerobic Culture (surgical/deep wound)     Status: None (Preliminary result)   Collection Time: 12/03/16  7:28 PM  Result Value Ref Range Status   Specimen Description WOUND  Final   Special Requests   Final    LEFT LUMBAR FOUR EPIDURAL SPACE PT ON VANC ANCEF SAMPLE D   Gram Stain   Final    RARE WBC PRESENT,BOTH PMN AND MONONUCLEAR RARE GRAM POSITIVE COCCI IN CLUSTERS    Culture   Final    ABUNDANT STAPHYLOCOCCUS AUREUS CULTURE REINCUBATED FOR BETTER GROWTH    Report Status PENDING  Incomplete         Radiology Studies: Mr Lumbar Spine Wo Contrast  Result Date: 12/03/2016 CLINICAL DATA:  Severe low back and bilateral leg pain since the patient fell in the bathtub 11/27/2016. EXAM: MRI LUMBAR SPINE WITHOUT CONTRAST TECHNIQUE: Multiplanar, multisequence MR imaging of the lumbar spine was performed. No intravenous contrast was administered. COMPARISON:  Plain  films lumbar spine 11/28/2016. FINDINGS: Segmentation:  Standard. Alignment:  Maintained. Vertebrae: No fracture. There is some marrow edema about the left and right L4-5 facet joints. Conus medullaris: Extends to the L1 level and appears normal. Paraspinal and other soft tissues: Extensive edema is seen in paraspinous musculature from the L1-2 level into the sacral segments. Edematous change is worst from mid L3 to S1-2. Focal fluid collections in paraspinous musculature are seen. On the left, the collection is centered at the level of the L5-S1 facet joints and measures 1.4 cm transverse by 2.1 cm by 5 cm craniocaudal. Collection on the right is centered at the level of the L5 pedicles measuring 1.6 cm transverse by 1.7 cm AP by 3.5 cm craniocaudal. Edema is also seen in the posterior aspect of the left psoas muscle. Disc levels: A posterior epidural collection is seen extending from approximately mid L4 to approximately S2. The collection measures up to 1.1 cm transverse by 0.9 cm AP by 7.8 cm craniocaudal. The collection causes marked compression of the thecal sac appearing worst from at L4-5 to the S1-2 level. The entire extent of the collection is not imaged in the axial plane. Intervertebral discs appear normal from T12-L1 to L4-5. Broad-based central protrusion at L5-S1 is identified. The foramina are open at this level. IMPRESSION: Extensive edema posterior paraspinous muscles with focal fluid collections most worrisome for pyomyositis. Epidural fluid collection as described above is most consistent with epidural abscess and causes marked compression of the thecal sac from L4-5 into the sacral segments. Given history of fall, hematoma is possible but highly unlikely. Marrow edema in the L4-5 facet joints worrisome for osteomyelitis. Broad-based central protrusion L5-S1 contributes to central canal stenosis in conjunction with epidural fluid. Critical Value/emergent results were called by telephone at the  time of interpretation on 12/03/2016 at 1:42 pm to Dr. Deatra JamesVYVYAN SUN , who verbally acknowledged these results. Electronically Signed   By: Drusilla Kannerhomas  Dalessio M.D.   On: 12/03/2016 13:46   Dg Lumbar Spine 1 View  Result Date: 12/03/2016 CLINICAL DATA:  Lumbar laminectomy for epidural abscess L4-S1 EXAM: LUMBAR SPINE - 1 VIEW COMPARISON:  Cross-table lateral portable intraoperative image 190 7 hours compared to MRI lumbar spine 12/03/2016. FINDINGS: MR labeled with 5 lumbar vertebra. Metallic probe via dorsal approach projects dorsal to the mid L5 level. Tissue spreader and sponge project dorsal to the spine at the L4-S1 levels. Vertebral body and disc space heights maintained. IMPRESSION: Dorsal localization of the mid L5 level. Electronically Signed   By: Ulyses SouthwardMark  Boles M.D.   On: 12/03/2016 20:39        Scheduled Meds: .  ceFAZolin (ANCEF) IV  2 g Intravenous Q8H  . LORazepam  2 mg Oral UD  . sodium chloride flush  3 mL Intravenous Q12H   Continuous Infusions: . sodium chloride       LOS: 1 day    Time spent: 35 minutes.     Kathlen ModyAKULA,Numan Zylstra, MD [facility] Pager [phone]  If 7PM-7AM, please contact night-coverage [email or web address] Password [email or web address] 12/04/2016, 3:32 PM

## 2016-12-04 NOTE — Progress Notes (Signed)
Pt stated dilaudid didn't helped paged MD with new order of oxycodone IR.  Then continued to complain that medication was not working paged MD with new order of valium. Offered ice pack but pt refused.  C/o of severe headache administered tylenol and continued to complain that pain med not working paged MD with new order of Toradol. Pt stated headache subsided. Administered Toradol and pt appears comfortable and asleep.

## 2016-12-04 NOTE — Progress Notes (Signed)
Pharmacy Antibiotic Note  Larry Best is a 30 y.o. male admitted on 12/03/2016 with spinal epidural abscess.  Pharmacy has been consulted for Vancomycin dosing. Pt s/p fall x 1 week ago with progressive back pain. S/P OR for decompressive laminectomy, purulent material was encountered and cultured. WBC is elevated. Renal function ok.   Plan:  -Vancomycin 1250 mg IV q8h -Trend WBC, temp, renal function -Drug levels at steady state -F/U cultures for directed therapy  Height: 5\' 9"  (175.3 cm) Weight: 222 lb (100.7 kg) IBW/kg (Calculated) : 70.7  Temp (24hrs), Avg:97.5 F (36.4 C), Min:97 F (36.1 C), Max:98 F (36.7 C)   Recent Labs Lab 11/28/16 1000 12/03/16 1553 12/03/16 1629  WBC  --  18.5*  --   CREATININE 0.90 0.76  --   LATICACIDVEN  --   --  1.18    Estimated Creatinine Clearance: 159.4 mL/min (by C-G formula based on SCr of 0.76 mg/dL).    No Known Allergies   Larry Best, Larry Best 12/04/2016 12:01 AM

## 2016-12-04 NOTE — Evaluation (Signed)
Occupational Therapy Evaluation Patient Details Name: Larry HartCharles Z Tourangeau MRN: [id number] DOB: 06/21/1986 Today's Date: 12/04/2016    History of Present Illness 30 yo male, s/p fall with persistent back pain.  Presented to ED with lumbar fluid accumulation, abcess, and infection.  Now post L4-S1 Laminectomies.   Clinical Impression   Patient is s/p L4-s1 laminectomies secondary to abscess and infection surgery resulting in functional limitations due to the deficits listed below (see OT problem list). PTA was independent for all adls. Patient will benefit from skilled OT acutely to increase independence and safety with ADLS to allow discharge home without follow. Girlfriend home has all DME required.     Follow Up Recommendations  No OT follow up    Equipment Recommendations  None recommended by OT    Recommendations for Other Services       Precautions / Restrictions Precautions Precautions: Fall;Back Precaution Booklet Issued: Yes (comment) Precaution Comments: Provided handout, reviewed handout with girlfriend. Restrictions Weight Bearing Restrictions: No      Mobility Bed Mobility Overal bed mobility: Needs Assistance Bed Mobility: Rolling;Sit to Sidelying Rolling: Min assist       Sit to sidelying: Min assist;+2 for physical assistance General bed mobility comments: Instruction for log roll technique with patient and girlfriend.  Transfers Overall transfer level: Needs assistance Equipment used: Rolling walker (2 wheeled);None Transfers: Sit to/from Stand;Lateral/Scoot Transfers Sit to Stand: Min assist        Lateral/Scoot Transfers: Min guard General transfer comment: Patient impulsive due to pain, cues to slow down and work on improved technique.    Balance Overall balance assessment: Needs assistance   Sitting balance-Leahy Scale: Fair     Standing balance support: Single extremity supported Standing balance-Leahy Scale: Poor                               ADL Overall ADL's : Needs assistance/impaired Eating/Feeding: Set up Eating/Feeding Details (indicate cue type and reason): static standing drinking soda with one UE suport Grooming: Wash/dry face;Set up   Upper Body Bathing: Minimal assistance   Lower Body Bathing: Minimal assistance           Toilet Transfer: Minimal assistance           Functional mobility during ADLs: Minimal assistance;Rolling walker General ADL Comments: pt diaphoretic throughout session. pt agreeable to movement due to static standing to void bladder in urinal. pt reports pain is what keeps him from doing more. pt reports after progressing into hall that light ( brightness) making HA severe. pt biggest c/o this session migraine HA     Vision     Perception     Praxis      Pertinent Vitals/Pain Pain Assessment: 0-10 Pain Score: 9  Pain Location: lower back just above my butt Pain Descriptors / Indicators: Constant;Guarding;Grimacing;Sharp Pain Intervention(s): Limited activity within patient's tolerance;Monitored during session;Premedicated before session;Repositioned;Patient requesting pain meds-RN notified;Ice applied     Hand Dominance Right   Extremity/Trunk Assessment Upper Extremity Assessment Upper Extremity Assessment: Overall WFL for tasks assessed   Lower Extremity Assessment Lower Extremity Assessment: Defer to PT evaluation   Cervical / Trunk Assessment Cervical / Trunk Assessment: Other exceptions (s/p surg )   Communication Communication Communication: No difficulties   Cognition Arousal/Alertness: Awake/alert Behavior During Therapy: Restless;Anxious;Impulsive Overall Cognitive Status: Within Functional Limits for tasks assessed  General Comments: Pain limiting function   General Comments       Exercises       Shoulder Instructions      Home Living Family/patient expects to be discharged to:: Private  residence Living Arrangements: Non-relatives/Friends Available Help at Discharge: Friend(s);Available 24 hours/day Type of Home: House Home Access: Stairs to enter Entergy CorporationEntrance Stairs-Number of Steps: 1   Home Layout: One level     Bathroom Shower/Tub: Chief Strategy OfficerTub/shower unit   Bathroom Toilet: Standard     Home Equipment: Wheelchair - Fluor Corporationmanual;Walker - 2 wheels;Bedside commode;Hospital bed;Shower seat;Grab bars - tub/shower;Hand held shower head (girlfriend has MS and has all DME for him)   Additional Comments: staying at girlfriend parents house and can use all her DME      Prior Functioning/Environment Level of Independence: Independent        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[employer], cooking        OT Problem List: Decreased strength;Decreased activity tolerance;Impaired balance (sitting and/or standing);Decreased safety awareness;Decreased knowledge of use of DME or AE;Decreased knowledge of precautions;Pain   OT Treatment/Interventions: Self-care/ADL training;Therapeutic exercise;DME and/or AE instruction;Therapeutic activities;Patient/family education;Balance training    OT Goals(Current goals can be found in the care plan section) Acute Rehab OT Goals Patient Stated Goal: Stop hurting OT Goal Formulation: With patient Time For Goal Achievement: 12/18/16 Potential to Achieve Goals: Good  OT Frequency: Min 2X/week   Barriers to D/C:            Co-evaluation PT/OT/SLP Co-Evaluation/Treatment: Yes Reason for Co-Treatment: Complexity of the patient's impairments (multi-system involvement);For patient/therapist safety;To address functional/ADL transfers PT goals addressed during session: Mobility/safety with mobility;Balance;Proper use of DME OT goals addressed during session: ADL's and self-care;Strengthening/ROM      End of Session Equipment Utilized During Treatment: Rolling walker;Gait belt Nurse Communication: Mobility status;Precautions;Patient requests pain meds  Activity  Tolerance: Patient limited by pain Patient left: in bed;with call bell/phone within reach;with family/visitor present;with nursing/sitter in room   Time: 1400-1420 OT Time Calculation (min): 20 min Charges:  OT General Charges $OT Visit: 1 Procedure OT Evaluation $OT Eval Moderate Complexity: 1 Procedure G-Codes:    Boone MasterJones, Irisa Grimsley B 12/04/2016, 3:25 PM  Mateo FlowJones, Brynn   OTR/L Pager: [phone] Office: [phone] .

## 2016-12-05 ENCOUNTER — Inpatient Hospital Stay (HOSPITAL_COMMUNITY): Payer: Self-pay

## 2016-12-05 DIAGNOSIS — F1990 Other psychoactive substance use, unspecified, uncomplicated: Secondary | ICD-10-CM

## 2016-12-05 DIAGNOSIS — A4901 Methicillin susceptible Staphylococcus aureus infection, unspecified site: Secondary | ICD-10-CM

## 2016-12-05 LAB — HIV ANTIBODY (ROUTINE TESTING W REFLEX): HIV Screen 4th Generation wRfx: NONREACTIVE

## 2016-12-05 MED ORDER — NICOTINE 21 MG/24HR TD PT24
21.0000 mg | MEDICATED_PATCH | Freq: Every day | TRANSDERMAL | Status: DC
  Administered 2016-12-06: 21 mg via TRANSDERMAL
  Filled 2016-12-05 (×4): qty 1

## 2016-12-05 MED ORDER — DIAZEPAM 5 MG/ML IJ SOLN
5.0000 mg | Freq: Once | INTRAMUSCULAR | Status: AC
  Administered 2016-12-06: 5 mg via INTRAVENOUS
  Filled 2016-12-05 (×3): qty 2

## 2016-12-05 NOTE — Progress Notes (Signed)
Subjective: Patient reports improving  Objective: Vital signs in last 24 hours: Temp:  [97.8 F (36.6 C)-99 F (37.2 C)] 97.8 F (36.6 C) (12/24 0500) Pulse Rate:  [89-122] 106 (12/24 0500) Resp:  [16-22] 22 (12/23 1640) BP: (121-137)/(59-78) 135/75 (12/24 0500) SpO2:  [92 %-98 %] 96 % (12/24 0500)  Intake/Output from previous day: 12/23 0701 - 12/24 0700 In: 100 [IV Piggyback:100] Out: 1785 [Urine:1725; Drains:60] Intake/Output this shift: No intake/output data recorded.  Physical Exam: Strength full in both lower extremities.  Dressings CDI.  Drain with 60cc output.  Lab Results:  Recent Labs  12/03/16 1553 12/04/16 0459  WBC 18.5* 17.9*  HGB 11.8* 11.3*  HCT 35.6* 33.9*  PLT 229 228   BMET  Recent Labs  12/03/16 1553 12/04/16 0459  NA 136 137  K 3.5 3.7  CL 102 100*  CO2 25 26  GLUCOSE 91 100*  BUN 9 5*  CREATININE 0.76 0.86  CALCIUM 8.2* 8.1*    Studies/Results: Mr Lumbar Spine Wo Contrast  Result Date: 12/03/2016 CLINICAL DATA:  Severe low back and bilateral leg pain since the patient fell in the bathtub 11/27/2016. EXAM: MRI LUMBAR SPINE WITHOUT CONTRAST TECHNIQUE: Multiplanar, multisequence MR imaging of the lumbar spine was performed. No intravenous contrast was administered. COMPARISON:  Plain films lumbar spine 11/28/2016. FINDINGS: Segmentation:  Standard. Alignment:  Maintained. Vertebrae: No fracture. There is some marrow edema about the left and right L4-5 facet joints. Conus medullaris: Extends to the L1 level and appears normal. Paraspinal and other soft tissues: Extensive edema is seen in paraspinous musculature from the L1-2 level into the sacral segments. Edematous change is worst from mid L3 to S1-2. Focal fluid collections in paraspinous musculature are seen. On the left, the collection is centered at the level of the L5-S1 facet joints and measures 1.4 cm transverse by 2.1 cm by 5 cm craniocaudal. Collection on the right is centered at the  level of the L5 pedicles measuring 1.6 cm transverse by 1.7 cm AP by 3.5 cm craniocaudal. Edema is also seen in the posterior aspect of the left psoas muscle. Disc levels: A posterior epidural collection is seen extending from approximately mid L4 to approximately S2. The collection measures up to 1.1 cm transverse by 0.9 cm AP by 7.8 cm craniocaudal. The collection causes marked compression of the thecal sac appearing worst from at L4-5 to the S1-2 level. The entire extent of the collection is not imaged in the axial plane. Intervertebral discs appear normal from T12-L1 to L4-5. Broad-based central protrusion at L5-S1 is identified. The foramina are open at this level. IMPRESSION: Extensive edema posterior paraspinous muscles with focal fluid collections most worrisome for pyomyositis. Epidural fluid collection as described above is most consistent with epidural abscess and causes marked compression of the thecal sac from L4-5 into the sacral segments. Given history of fall, hematoma is possible but highly unlikely. Marrow edema in the L4-5 facet joints worrisome for osteomyelitis. Broad-based central protrusion L5-S1 contributes to central canal stenosis in conjunction with epidural fluid. Critical Value/emergent results were called by telephone at the time of interpretation on 12/03/2016 at 1:42 pm to Dr. Deatra JamesVYVYAN SUN , who verbally acknowledged these results. Electronically Signed   By: Drusilla Kannerhomas  Dalessio M.D.   On: 12/03/2016 13:46   Dg Lumbar Spine 1 View  Result Date: 12/03/2016 CLINICAL DATA:  Lumbar laminectomy for epidural abscess L4-S1 EXAM: LUMBAR SPINE - 1 VIEW COMPARISON:  Cross-table lateral portable intraoperative image 190 7 hours compared to  MRI lumbar spine 12/03/2016. FINDINGS: MR labeled with 5 lumbar vertebra. Metallic probe via dorsal approach projects dorsal to the mid L5 level. Tissue spreader and sponge project dorsal to the spine at the L4-S1 levels. Vertebral body and disc space heights  maintained. IMPRESSION: Dorsal localization of the mid L5 level. Electronically Signed   By: Ulyses SouthwardMark  Boles M.D.   On: 12/03/2016 20:39    Assessment/Plan: Patient states feeling better.  Headache has gone from unbearable to 8/10.  Continue IV ABX.  Awaiting results of additional testing.    LOS: 2 days    Dorian HeckleSTERN,Billy Turvey D, MD 12/05/2016, 9:47 AM

## 2016-12-05 NOTE — Progress Notes (Signed)
PROGRESS NOTE    Larry HartCharles Z Radde  ZOX:[id number] DOB: 02/11/1986 DOA: 12/03/2016 [facility]    Brief Narrative:  Larry Best is a 30 y.o. male with  Hx of IVDU , reports a mechanical fall a week ago, comes back with worsening back pain, associated with fevers and chills  Found to have with MSSA bacteremia and epidural abscess with compression, s/p laminectomies .  MRI shows  lumbar diskitis,  Possible vertebral osteomyelitis.  Mri Brain ordered as he reports having headaches.   Assessment & Plan:   Principal Problem:   Spinal epidural abscess Active Problems:   Abscess in epidural space of lumbar spine   IVDU (intravenous drug user)   Staphylococcus aureus bacteremia   MSSA (methicillin susceptible Staphylococcus aureus) infection   Headache   Pyomyositis   MSSA bacteremia , epidural abscess, vertebral osteomyelitis: Started on IV cefazolin. Continue to monitor. Appreciate ID and neurosurgery input. Echocardiogram pending.  Will need TEE at some point if TTE is abnormal.  Repeat blood cultures ordered and pending.  Pain control, robaxin.  PT/ot eval.  Mild normocytic anemia:  Stable.   Headache: improved. MRI Brain ordered and pain control. No nausea, or vomiting.    h/o IVDU: Hepatitis panel pending.  HIV antibody is negative.    Leukocytosis: - improving.    Mild normocytic anemia:  Stable around 11.  Fever today: repeat blood cultures ordered.   DVT prophylaxis: SCD'S Code Status: (Full) Family Communication: none at bedside.  Disposition Plan: pending further eval.    Consultants:  Infectious disease.  Neurosurgery.   Procedures:  : Bilateral and Complete laminectomies of L4, L5, and S1 with medial facetectomies at L4-5 L5-S1 and foraminotomies of the L4, L5, S1, and S2 nerve roots on 12/22   Antimicrobials: cefazolin. 12/22   Subjective: Headache is better.   Objective: Vitals:   12/05/16 0500  12/05/16 1025 12/05/16 1422 12/05/16 1741  BP: 135/75 (!) 143/78 122/63 (!) 142/74  Pulse: (!) 106 (!) 131 (!) 106 (!) 125  Resp:  20 20 20   Temp: 97.8 F (36.6 C) 99 F (37.2 C) 98.7 F (37.1 C) (!) 101.6 F (38.7 C)  TempSrc: Oral Oral Oral Oral  SpO2: 96% 97% 93% 91%  Weight:      Height:        Intake/Output Summary (Last 24 hours) at 12/05/16 1856 Last data filed at 12/05/16 1800  Gross per 24 hour  Intake             1300 ml  Output              670 ml  Net              630 ml   Filed Weights   12/03/16 1445  Weight: 100.7 kg (222 lb)    Examination:  General exam: restless but appears comfortable. Marland Kitchen.  Respiratory system: Clear to auscultation. Respiratory effort normal. Cardiovascular system: S1 & S2 heard, tachycardia.  No JVD, murmurs, rubs, gallops or clicks. No pedal edema. Gastrointestinal system: Abdomen is nondistended, soft and nontender. No organomegaly or masses felt. Normal bowel sounds heard. Central nervous system: Alert and oriented. Able to move all extremities. Speech normal.  Extremities: no pedal edema.  Skin: No rashes, lesions or ulcers     Data Reviewed: I have personally reviewed following labs and imaging studies  CBC:  Recent Labs Lab 12/03/16 1553 12/04/16 0459  WBC 18.5* 17.9*  NEUTROABS 13.8* 12.7*  HGB 11.8* 11.3*  HCT 35.6* 33.9*  MCV 88.6 88.7  PLT 229 228   Basic Metabolic Panel:  Recent Labs Lab 12/03/16 1553 12/04/16 0459  NA 136 137  K 3.5 3.7  CL 102 100*  CO2 25 26  GLUCOSE 91 100*  BUN 9 5*  CREATININE 0.76 0.86  CALCIUM 8.2* 8.1*   GFR: Estimated Creatinine Clearance: 148.3 mL/min (by C-G formula based on SCr of 0.86 mg/dL). Liver Function Tests:  Recent Labs Lab 12/04/16 0459  AST 23  ALT 33  ALKPHOS 143*  BILITOT 0.5  PROT 5.7*  ALBUMIN 2.2*   No results for input(s): LIPASE, AMYLASE in the last 168 hours. No results for input(s): AMMONIA in the last 168 hours. Coagulation  Profile:  Recent Labs Lab 12/03/16 1553  INR 0.98   Cardiac Enzymes: No results for input(s): CKTOTAL, CKMB, CKMBINDEX, TROPONINI in the last 168 hours. BNP (last 3 results) No results for input(s): PROBNP in the last 8760 hours. HbA1C: No results for input(s): HGBA1C in the last 72 hours. CBG: No results for input(s): GLUCAP in the last 168 hours. Lipid Profile: No results for input(s): CHOL, HDL, LDLCALC, TRIG, CHOLHDL, LDLDIRECT in the last 72 hours. Thyroid Function Tests: No results for input(s): TSH, T4TOTAL, FREET4, T3FREE, THYROIDAB in the last 72 hours. Anemia Panel: No results for input(s): VITAMINB12, FOLATE, FERRITIN, TIBC, IRON, RETICCTPCT in the last 72 hours. Sepsis Labs:  Recent Labs Lab 12/03/16 1629  LATICACIDVEN 1.18    Recent Results (from the past 240 hour(s))  Blood culture (routine x 2)     Status: Abnormal (Preliminary result)   Collection Time: 12/03/16  3:48 PM  Result Value Ref Range Status   Specimen Description BLOOD RIGHT ANTECUBITAL  Final   Special Requests BOTTLES DRAWN AEROBIC AND ANAEROBIC 5CC  Final   Culture  Setup Time   Final    GRAM POSITIVE COCCI IN CLUSTERS IN BOTH AEROBIC AND ANAEROBIC BOTTLES CRITICAL VALUE NOTED.  VALUE IS CONSISTENT WITH PREVIOUSLY REPORTED AND CALLED VALUE.    Culture STAPHYLOCOCCUS AUREUS (A)  Final   Report Status PENDING  Incomplete  Blood culture (routine x 2)     Status: Abnormal (Preliminary result)   Collection Time: 12/03/16  3:56 PM  Result Value Ref Range Status   Specimen Description BLOOD RIGHT HAND  Final   Special Requests BOTTLES DRAWN AEROBIC AND ANAEROBIC 5CC  Final   Culture  Setup Time   Final    GRAM POSITIVE COCCI IN CLUSTERS IN BOTH AEROBIC AND ANAEROBIC BOTTLES CRITICAL RESULT CALLED TO, READ BACK BY AND VERIFIED WITH: TONYA JOHNSON,PHARMD @0724  12/04/16 MKELLY,MLT    Culture STAPHYLOCOCCUS AUREUS (A)  Final   Report Status PENDING  Incomplete  Blood Culture ID Panel (Reflexed)      Status: Abnormal   Collection Time: 12/03/16  3:56 PM  Result Value Ref Range Status   Enterococcus species NOT DETECTED NOT DETECTED Final   Listeria monocytogenes NOT DETECTED NOT DETECTED Final   Staphylococcus species DETECTED (A) NOT DETECTED Final    Comment: CRITICAL RESULT CALLED TO, READ BACK BY AND VERIFIED WITH: Renard Hamper, PHARMD @0724  12/04/16 MKELLY,MLT    Staphylococcus aureus DETECTED (A) NOT DETECTED Final    Comment: CRITICAL RESULT CALLED TO, READ BACK BY AND VERIFIED WITH: Renard Hamper, PHARMD @0724  12/04/16 MKELLY,MLT    Methicillin resistance NOT DETECTED NOT DETECTED Final   Streptococcus species NOT DETECTED NOT DETECTED Final   Streptococcus agalactiae NOT DETECTED NOT DETECTED Final  Streptococcus pneumoniae NOT DETECTED NOT DETECTED Final   Streptococcus pyogenes NOT DETECTED NOT DETECTED Final   Acinetobacter baumannii NOT DETECTED NOT DETECTED Final   Enterobacteriaceae species NOT DETECTED NOT DETECTED Final   Enterobacter cloacae complex NOT DETECTED NOT DETECTED Final   Escherichia coli NOT DETECTED NOT DETECTED Final   Klebsiella oxytoca NOT DETECTED NOT DETECTED Final   Klebsiella pneumoniae NOT DETECTED NOT DETECTED Final   Proteus species NOT DETECTED NOT DETECTED Final   Serratia marcescens NOT DETECTED NOT DETECTED Final   Haemophilus influenzae NOT DETECTED NOT DETECTED Final   Neisseria meningitidis NOT DETECTED NOT DETECTED Final   Pseudomonas aeruginosa NOT DETECTED NOT DETECTED Final   Candida albicans NOT DETECTED NOT DETECTED Final   Candida glabrata NOT DETECTED NOT DETECTED Final   Candida krusei NOT DETECTED NOT DETECTED Final   Candida parapsilosis NOT DETECTED NOT DETECTED Final   Candida tropicalis NOT DETECTED NOT DETECTED Final  Aerobic/Anaerobic Culture (surgical/deep wound)     Status: None (Preliminary result)   Collection Time: 12/03/16  7:14 PM  Result Value Ref Range Status   Specimen Description WOUND  Final    Special Requests SWAB OF LEFT PARASPINAL PT ON ANCEF VANC SAMPLE A  Final   Gram Stain   Final    RARE WBC PRESENT,BOTH PMN AND MONONUCLEAR FEW GRAM POSITIVE COCCI IN CLUSTERS    Culture   Final    ABUNDANT STAPHYLOCOCCUS AUREUS SUSCEPTIBILITIES TO FOLLOW NO ANAEROBES ISOLATED; CULTURE IN PROGRESS FOR 5 DAYS    Report Status PENDING  Incomplete  Aerobic/Anaerobic Culture (surgical/deep wound)     Status: None (Preliminary result)   Collection Time: 12/03/16  7:16 PM  Result Value Ref Range Status   Specimen Description WOUND  Final   Special Requests LEFT PARASPINAL SWAB PT ON VANC ANCEF SAMPLE B  Final   Gram Stain   Final    MODERATE WBC PRESENT,BOTH PMN AND MONONUCLEAR MODERATE GRAM POSITIVE COCCI IN CLUSTERS    Culture   Final    ABUNDANT STAPHYLOCOCCUS AUREUS NO ANAEROBES ISOLATED; CULTURE IN PROGRESS FOR 5 DAYS    Report Status PENDING  Incomplete  Aerobic/Anaerobic Culture (surgical/deep wound)     Status: None (Preliminary result)   Collection Time: 12/03/16  7:21 PM  Result Value Ref Range Status   Specimen Description WOUND  Final   Special Requests LEFT LUMBAR FACET 4,5 PT ON VANC ANCEF SAMPLE C  Final   Gram Stain   Final    FEW WBC PRESENT, PREDOMINANTLY PMN RARE GRAM POSITIVE COCCI IN PAIRS    Culture   Final    FEW STAPHYLOCOCCUS AUREUS NO ANAEROBES ISOLATED; CULTURE IN PROGRESS FOR 5 DAYS    Report Status PENDING  Incomplete  Aerobic/Anaerobic Culture (surgical/deep wound)     Status: None (Preliminary result)   Collection Time: 12/03/16  7:28 PM  Result Value Ref Range Status   Specimen Description WOUND  Final   Special Requests   Final    LEFT LUMBAR FOUR EPIDURAL SPACE PT ON VANC ANCEF SAMPLE D   Gram Stain   Final    RARE WBC PRESENT,BOTH PMN AND MONONUCLEAR RARE GRAM POSITIVE COCCI IN CLUSTERS    Culture   Final    ABUNDANT STAPHYLOCOCCUS AUREUS NO ANAEROBES ISOLATED; CULTURE IN PROGRESS FOR 5 DAYS    Report Status PENDING  Incomplete    Culture, blood (Routine X 2) w Reflex to ID Panel     Status: None (Preliminary result)   Collection  Time: 12/04/16  2:10 PM  Result Value Ref Range Status   Specimen Description BLOOD LEFT ARM  Final   Special Requests BOTTLES DRAWN AEROBIC AND ANAEROBIC 5CC  Final   Culture NO GROWTH < 24 HOURS  Final   Report Status PENDING  Incomplete  Culture, blood (Routine X 2) w Reflex to ID Panel     Status: None (Preliminary result)   Collection Time: 12/04/16  2:20 PM  Result Value Ref Range Status   Specimen Description BLOOD LEFT HAND  Final   Special Requests BOTTLES DRAWN AEROBIC AND ANAEROBIC 5CC  Final   Culture NO GROWTH < 24 HOURS  Final   Report Status PENDING  Incomplete         Radiology Studies: Dg Lumbar Spine 1 View  Result Date: 12/03/2016 CLINICAL DATA:  Lumbar laminectomy for epidural abscess L4-S1 EXAM: LUMBAR SPINE - 1 VIEW COMPARISON:  Cross-table lateral portable intraoperative image 190 7 hours compared to MRI lumbar spine 12/03/2016. FINDINGS: MR labeled with 5 lumbar vertebra. Metallic probe via dorsal approach projects dorsal to the mid L5 level. Tissue spreader and sponge project dorsal to the spine at the L4-S1 levels. Vertebral body and disc space heights maintained. IMPRESSION: Dorsal localization of the mid L5 level. Electronically Signed   By: Ulyses SouthwardMark  Boles M.D.   On: 12/03/2016 20:39        Scheduled Meds: .  ceFAZolin (ANCEF) IV  2 g Intravenous Q8H  . LORazepam  2 mg Oral UD  . nicotine  21 mg Transdermal Daily  . sodium chloride flush  3 mL Intravenous Q12H   Continuous Infusions: . sodium chloride       LOS: 2 days    Time spent: 35 minutes.     Kathlen ModyAKULA,Milley Vining, MD [facility] Pager [phone]  If 7PM-7AM, please contact night-coverage [email or web address] Password [email or web address] 12/05/2016, 6:56 PM

## 2016-12-05 NOTE — Progress Notes (Signed)
Patient sent down for MRI of brain. Patient verbally anxious. Ativan 2 mg given at 0120. Repeat dose of 1 mg given at 0226 per note under [facility] order [facility].  This RN offered patient dilaudid for pain control, but patient refused until after MRI was completed. Patient transported down to MRI in wheelchair.

## 2016-12-05 NOTE — Progress Notes (Signed)
Subjective: No new complaints. HA much better   Antibiotics:  Anti-infectives    Start     Dose/Rate Route Frequency Ordered Stop   12/05/16 0500  ceFAZolin (ANCEF) IVPB 2g/100 mL premix     2 g 200 mL/hr over 30 Minutes Intravenous Every 8 hours 12/04/16 2204     12/04/16 1730  ceFAZolin (ANCEF) IVPB 2g/100 mL premix  Status:  Discontinued     2 g 200 mL/hr over 30 Minutes Intravenous Every 8 hours 12/04/16 1432 12/04/16 2204   12/04/16 0300  vancomycin (VANCOCIN) 1,250 mg in sodium chloride 0.9 % 250 mL IVPB  Status:  Discontinued     1,250 mg 166.7 mL/hr over 90 Minutes Intravenous Every 8 hours 12/03/16 2330 12/04/16 0934   12/03/16 2359  ceFAZolin (ANCEF) IVPB 2g/100 mL premix     2 g 200 mL/hr over 30 Minutes Intravenous Every 8 hours 12/03/16 2245 12/04/16 0958   12/03/16 1946  vancomycin (VANCOCIN) powder  Status:  Discontinued       As needed 12/03/16 1947 12/03/16 2043   12/03/16 1855  50,000 units bacitracin in 0.9% normal saline 250 mL irrigation  Status:  Discontinued       As needed 12/03/16 1855 12/03/16 2043      Medications: Scheduled Meds: .  ceFAZolin (ANCEF) IV  2 g Intravenous Q8H  . LORazepam  2 mg Oral UD  . nicotine  21 mg Transdermal Daily  . sodium chloride flush  3 mL Intravenous Q12H   Continuous Infusions: . sodium chloride     PRN Meds:.acetaminophen **OR** acetaminophen, diazepam, HYDROmorphone (DILAUDID) injection, ketorolac, menthol-cetylpyridinium **OR** phenol, methocarbamol (ROBAXIN)  IV, ondansetron (ZOFRAN) IV, oxyCODONE, sodium chloride flush    Objective: Weight change:   Intake/Output Summary (Last 24 hours) at 12/05/16 1241 Last data filed at 12/05/16 1223  Gross per 24 hour  Intake              820 ml  Output             1260 ml  Net             -440 ml   Blood pressure (!) 143/78, pulse (!) 131, temperature 99 F (37.2 C), temperature source Oral, resp. rate 20, height 5\' 9"  (1.753 m), weight 222 lb (100.7  kg), SpO2 97 %. Temp:  [97.8 F (36.6 C)-99 F (37.2 C)] 99 F (37.2 C) (12/24 1025) Pulse Rate:  [89-131] 131 (12/24 1025) Resp:  [20-22] 20 (12/24 1025) BP: (121-143)/(59-78) 143/78 (12/24 1025) SpO2:  [94 %-98 %] 97 % (12/24 1025)  Physical Exam: General: Alert and awake, oriented x3, not in any acute distress. HEENT: anicteric sclera, pupils reactive to light and accommodation, EOMI CVS regular rate, normal r,  no murmur rubs or gallops Chest: clear to auscultation bilaterally, no wheezing, rales or rhonchi Abdomen: soft nontender, nondistended, normal bowel sounds, Extremities: no  clubbing or edema noted bilaterally Skin: no rashes Lymph: no new lymphadenopathy Neuro: nonfocal  CBC: @LABBLAST3 (wbc3,Hgb:3,Hct:3,Plt:3,INR:3APTT:3)@   BMET  Recent Labs  12/03/16 1553 12/04/16 0459  NA 136 137  K 3.5 3.7  CL 102 100*  CO2 25 26  GLUCOSE 91 100*  BUN 9 5*  CREATININE 0.76 0.86  CALCIUM 8.2* 8.1*     Liver Panel   Recent Labs  12/04/16 0459  PROT 5.7*  ALBUMIN 2.2*  AST 23  ALT 33  ALKPHOS 143*  BILITOT 0.5  Sedimentation Rate  Recent Labs  12/03/16 1553  ESRSEDRATE 56*   C-Reactive Protein  Recent Labs  12/03/16 1553  CRP 27.4*    Micro Results: Recent Results (from the past 720 hour(s))  Blood culture (routine x 2)     Status: Abnormal (Preliminary result)   Collection Time: 12/03/16  3:48 PM  Result Value Ref Range Status   Specimen Description BLOOD RIGHT ANTECUBITAL  Final   Special Requests BOTTLES DRAWN AEROBIC AND ANAEROBIC 5CC  Final   Culture  Setup Time   Final    GRAM POSITIVE COCCI IN CLUSTERS IN BOTH AEROBIC AND ANAEROBIC BOTTLES CRITICAL VALUE NOTED.  VALUE IS CONSISTENT WITH PREVIOUSLY REPORTED AND CALLED VALUE.    Culture STAPHYLOCOCCUS AUREUS (A)  Final   Report Status PENDING  Incomplete  Blood culture (routine x 2)     Status: Abnormal (Preliminary result)   Collection Time: 12/03/16  3:56 PM  Result  Value Ref Range Status   Specimen Description BLOOD RIGHT HAND  Final   Special Requests BOTTLES DRAWN AEROBIC AND ANAEROBIC 5CC  Final   Culture  Setup Time   Final    GRAM POSITIVE COCCI IN CLUSTERS IN BOTH AEROBIC AND ANAEROBIC BOTTLES CRITICAL RESULT CALLED TO, READ BACK BY AND VERIFIED WITH: TONYA JOHNSON,PHARMD @0724  12/04/16 MKELLY,MLT    Culture STAPHYLOCOCCUS AUREUS (A)  Final   Report Status PENDING  Incomplete  Blood Culture ID Panel (Reflexed)     Status: Abnormal   Collection Time: 12/03/16  3:56 PM  Result Value Ref Range Status   Enterococcus species NOT DETECTED NOT DETECTED Final   Listeria monocytogenes NOT DETECTED NOT DETECTED Final   Staphylococcus species DETECTED (A) NOT DETECTED Final    Comment: CRITICAL RESULT CALLED TO, READ BACK BY AND VERIFIED WITH: Renard HamperONYA JOHNSON, PHARMD @0724  12/04/16 MKELLY,MLT    Staphylococcus aureus DETECTED (A) NOT DETECTED Final    Comment: CRITICAL RESULT CALLED TO, READ BACK BY AND VERIFIED WITH: Renard HamperONYA JOHNSON, PHARMD @0724  12/04/16 MKELLY,MLT    Methicillin resistance NOT DETECTED NOT DETECTED Final   Streptococcus species NOT DETECTED NOT DETECTED Final   Streptococcus agalactiae NOT DETECTED NOT DETECTED Final   Streptococcus pneumoniae NOT DETECTED NOT DETECTED Final   Streptococcus pyogenes NOT DETECTED NOT DETECTED Final   Acinetobacter baumannii NOT DETECTED NOT DETECTED Final   Enterobacteriaceae species NOT DETECTED NOT DETECTED Final   Enterobacter cloacae complex NOT DETECTED NOT DETECTED Final   Escherichia coli NOT DETECTED NOT DETECTED Final   Klebsiella oxytoca NOT DETECTED NOT DETECTED Final   Klebsiella pneumoniae NOT DETECTED NOT DETECTED Final   Proteus species NOT DETECTED NOT DETECTED Final   Serratia marcescens NOT DETECTED NOT DETECTED Final   Haemophilus influenzae NOT DETECTED NOT DETECTED Final   Neisseria meningitidis NOT DETECTED NOT DETECTED Final   Pseudomonas aeruginosa NOT DETECTED NOT  DETECTED Final   Candida albicans NOT DETECTED NOT DETECTED Final   Candida glabrata NOT DETECTED NOT DETECTED Final   Candida krusei NOT DETECTED NOT DETECTED Final   Candida parapsilosis NOT DETECTED NOT DETECTED Final   Candida tropicalis NOT DETECTED NOT DETECTED Final  Aerobic/Anaerobic Culture (surgical/deep wound)     Status: None (Preliminary result)   Collection Time: 12/03/16  7:14 PM  Result Value Ref Range Status   Specimen Description WOUND  Final   Special Requests SWAB OF LEFT PARASPINAL PT ON ANCEF VANC SAMPLE A  Final   Gram Stain   Final    RARE WBC PRESENT,BOTH PMN  AND MONONUCLEAR FEW GRAM POSITIVE COCCI IN CLUSTERS    Culture   Final    ABUNDANT STAPHYLOCOCCUS AUREUS SUSCEPTIBILITIES TO FOLLOW    Report Status PENDING  Incomplete  Aerobic/Anaerobic Culture (surgical/deep wound)     Status: None (Preliminary result)   Collection Time: 12/03/16  7:16 PM  Result Value Ref Range Status   Specimen Description WOUND  Final   Special Requests LEFT PARASPINAL SWAB PT ON VANC ANCEF SAMPLE B  Final   Gram Stain   Final    MODERATE WBC PRESENT,BOTH PMN AND MONONUCLEAR MODERATE GRAM POSITIVE COCCI IN CLUSTERS    Culture ABUNDANT STAPHYLOCOCCUS AUREUS  Final   Report Status PENDING  Incomplete  Aerobic/Anaerobic Culture (surgical/deep wound)     Status: None (Preliminary result)   Collection Time: 12/03/16  7:21 PM  Result Value Ref Range Status   Specimen Description WOUND  Final   Special Requests LEFT LUMBAR FACET 4,5 PT ON VANC ANCEF SAMPLE C  Final   Gram Stain   Final    FEW WBC PRESENT, PREDOMINANTLY PMN RARE GRAM POSITIVE COCCI IN PAIRS    Culture FEW STAPHYLOCOCCUS AUREUS  Final   Report Status PENDING  Incomplete  Aerobic/Anaerobic Culture (surgical/deep wound)     Status: None (Preliminary result)   Collection Time: 12/03/16  7:28 PM  Result Value Ref Range Status   Specimen Description WOUND  Final   Special Requests   Final    LEFT LUMBAR FOUR  EPIDURAL SPACE PT ON VANC ANCEF SAMPLE D   Gram Stain   Final    RARE WBC PRESENT,BOTH PMN AND MONONUCLEAR RARE GRAM POSITIVE COCCI IN CLUSTERS    Culture ABUNDANT STAPHYLOCOCCUS AUREUS  Final   Report Status PENDING  Incomplete  Culture, blood (Routine X 2) w Reflex to ID Panel     Status: None (Preliminary result)   Collection Time: 12/04/16  2:10 PM  Result Value Ref Range Status   Specimen Description BLOOD LEFT ARM  Final   Special Requests BOTTLES DRAWN AEROBIC AND ANAEROBIC 5CC  Final   Culture NO GROWTH < 24 HOURS  Final   Report Status PENDING  Incomplete  Culture, blood (Routine X 2) w Reflex to ID Panel     Status: None (Preliminary result)   Collection Time: 12/04/16  2:20 PM  Result Value Ref Range Status   Specimen Description BLOOD LEFT HAND  Final   Special Requests BOTTLES DRAWN AEROBIC AND ANAEROBIC 5CC  Final   Culture NO GROWTH < 24 HOURS  Final   Report Status PENDING  Incomplete    Studies/Results: Mr Lumbar Spine Wo Contrast  Result Date: 12/03/2016 CLINICAL DATA:  Severe low back and bilateral leg pain since the patient fell in the bathtub 11/27/2016. EXAM: MRI LUMBAR SPINE WITHOUT CONTRAST TECHNIQUE: Multiplanar, multisequence MR imaging of the lumbar spine was performed. No intravenous contrast was administered. COMPARISON:  Plain films lumbar spine 11/28/2016. FINDINGS: Segmentation:  Standard. Alignment:  Maintained. Vertebrae: No fracture. There is some marrow edema about the left and right L4-5 facet joints. Conus medullaris: Extends to the L1 level and appears normal. Paraspinal and other soft tissues: Extensive edema is seen in paraspinous musculature from the L1-2 level into the sacral segments. Edematous change is worst from mid L3 to S1-2. Focal fluid collections in paraspinous musculature are seen. On the left, the collection is centered at the level of the L5-S1 facet joints and measures 1.4 cm transverse by 2.1 cm by 5 cm craniocaudal. Collection  on  the right is centered at the level of the L5 pedicles measuring 1.6 cm transverse by 1.7 cm AP by 3.5 cm craniocaudal. Edema is also seen in the posterior aspect of the left psoas muscle. Disc levels: A posterior epidural collection is seen extending from approximately mid L4 to approximately S2. The collection measures up to 1.1 cm transverse by 0.9 cm AP by 7.8 cm craniocaudal. The collection causes marked compression of the thecal sac appearing worst from at L4-5 to the S1-2 level. The entire extent of the collection is not imaged in the axial plane. Intervertebral discs appear normal from T12-L1 to L4-5. Broad-based central protrusion at L5-S1 is identified. The foramina are open at this level. IMPRESSION: Extensive edema posterior paraspinous muscles with focal fluid collections most worrisome for pyomyositis. Epidural fluid collection as described above is most consistent with epidural abscess and causes marked compression of the thecal sac from L4-5 into the sacral segments. Given history of fall, hematoma is possible but highly unlikely. Marrow edema in the L4-5 facet joints worrisome for osteomyelitis. Broad-based central protrusion L5-S1 contributes to central canal stenosis in conjunction with epidural fluid. Critical Value/emergent results were called by telephone at the time of interpretation on 12/03/2016 at 1:42 pm to Dr. Deatra James , who verbally acknowledged these results. Electronically Signed   By: Drusilla Kanner M.D.   On: 12/03/2016 13:46   Dg Lumbar Spine 1 View  Result Date: 12/03/2016 CLINICAL DATA:  Lumbar laminectomy for epidural abscess L4-S1 EXAM: LUMBAR SPINE - 1 VIEW COMPARISON:  Cross-table lateral portable intraoperative image 190 7 hours compared to MRI lumbar spine 12/03/2016. FINDINGS: MR labeled with 5 lumbar vertebra. Metallic probe via dorsal approach projects dorsal to the mid L5 level. Tissue spreader and sponge project dorsal to the spine at the L4-S1 levels. Vertebral  body and disc space heights maintained. IMPRESSION: Dorsal localization of the mid L5 level. Electronically Signed   By: Ulyses Southward M.D.   On: 12/03/2016 20:39      Assessment/Plan:  INTERVAL HISTORY: MRI could not be completed due to anxiety and pain   Principal Problem:   Epidural abscess Active Problems:   Abscess in epidural space of lumbar spine   IVDU (intravenous drug user)   Staphylococcus aureus bacteremia   MSSA (methicillin susceptible Staphylococcus aureus) infection   Headache   Pyomyositis    Larry DRAPEAU is a 30 y.o. male with  Hx of IVDU now with MSSA bacteremia and epidural abscess with compression, lumbar diskitis, vertebral osteomyelitis at his post laminectomy and drainage of epidural abscess.  #1 Methicillin sensitive staphylococcus aureus bacteremia with epidural abscess and vertebral osteomyelitis in IV drug user                                             [facility] Staphylococcus aureus bacteremia  Staphylococcus aureus bacteremia (SAB) is associated with a high rate of complications and mortality.  Specific aspects of clinical management are critical to optimizing the outcome of patients with SAB.  Therefore, the [facility]) has initiated an intervention aimed at improving the management of SAB at [facility].  To do so, Infectious Diseases physicians are providing an evidence-based consult for the management of all patients with SAB.     Yes No Comments  Perform follow-up blood cultures (even if  the patient is afebrile) to ensure clearance of bacteremia [x]  []  Repeating blood cultures still incubating  Remove vascular catheter and obtain follow-up blood cultures after the removal of the catheter []  []  DO NOT PLACE PICC UNTIL WE HAVE PROVEN STERILITY OF BLOOD CULTURES AT DAY #5  Perform echocardiography to evaluate for endocarditis (transthoracic ECHO is 40-50% sensitive, TEE  is > 90% sensitive) [x]  []  Please keep in mind, that neither test can definitively EXCLUDE endocarditis, and that should clinical suspicion remain high for endocarditis the patient should then still be treated with an "endocarditis" duration of therapy = 6 weeks  He needs a TEE  Consult electrophysiologist to evaluate implanted cardiac device (pacemaker, ICD) []  []  na  Ensure source control []  []  Have all abscesses been drained effectively? Have deep seeded infections (septic joints or osteomyelitis) had appropriate surgical debridement?  He has undergone neurosurgery   Investigate for "metastatic" sites of infection []  []  Does the patient have ANY symptom or physical exam finding that would suggest a deeper infection (back or neck pain that may be suggestive of vertebral osteomyelitis or epidural abscess, muscle pain that could be a symptom of pyomyositis)?  Keep in mind that for deep seeded infections MRI imaging with contrast is preferred rather than other often insensitive tests such as plain x-rays, especially early in a patient's presentation.  MRI brain with contrast  Change antibiotic therapy to Cefazolin. If he has evidence of brain infection or left sided endocarditis will need to change to a brain penetrating antibiotic such as nafcillin or ceftriaxone.   Does not have evidence of a spinal fluid leak based on his lack of headaches while walking around  []  []  Beta-lactam antibiotics are preferred for MSSA due to higher cure rates.   If on Vancomycin, goal trough should be 15 - 20 mcg/mL  Estimated duration of IV antibiotic therapy:  6-8 weeks []  []  Consult case management for probably prolonged outpatient IV antibiotic therapy    #2 IVDU: We'll check for HIV hep C hep B. He was in his room with his girlfriend who is apparently the daughter of the couple that is been taking care of him in terms of helping him rehabilitate from his IV drug use problem. There is another individual  living house also going through treatment. The girlfriend of the patient and daughter of the couple taking care of him is also someone who is a recovered addict. Apparently this is something being done out of charity by couple and is rooted in their religious belief (though the daughter assures me they do not push this on those who come to live with them for treatment.  This setting is one in which I might be actually comfortable eventually DC the patient with PICC once we have proven negative blood cultures x 5 days and done everything needs to do for him from an inpatient standpoint such as investigate for endocarditis and make sure he is recovering from his lumbar surgery. Certainly if there is anything that is suspicious in terms of patient behavior or suitability of this living situation to ensure he remains off IVD I would have a fair amount confidence that a home health company would be able to pick this up.   Dr. Ninetta Lights will be covering tomorrow Christmas Day the 25th through the 28th and Dr. Drue Second the 29th through January 1 are available for questions.   LOS: 2 days   Acey Lav 12/05/2016, 12:41 PM

## 2016-12-05 NOTE — Progress Notes (Signed)
PT Cancellation Note  Patient Details Name: Larry Best MRN: [id number] DOB: 12/08/1986   Cancelled Treatment:    Reason Eval/Treat Not Completed: Other (comment) (Pt reports this is the first time he has gotten some rest but he will try later.  Will continue effort if time permits.  Pt reports walking in room earlier.  )   [name] [name] [name] 12/05/2016, 2:10 PM  [name], PTA pager [phone]

## 2016-12-06 ENCOUNTER — Inpatient Hospital Stay (HOSPITAL_COMMUNITY): Payer: Self-pay

## 2016-12-06 ENCOUNTER — Encounter (HOSPITAL_COMMUNITY): Payer: Self-pay | Admitting: *Deleted

## 2016-12-06 DIAGNOSIS — R7881 Bacteremia: Secondary | ICD-10-CM

## 2016-12-06 LAB — CULTURE, BLOOD (ROUTINE X 2)

## 2016-12-06 LAB — ECHOCARDIOGRAM COMPLETE
Height: 69 in
WEIGHTICAEL: 3552 [oz_av]

## 2016-12-06 MED ORDER — IBUPROFEN 200 MG PO TABS
400.0000 mg | ORAL_TABLET | Freq: Four times a day (QID) | ORAL | Status: DC | PRN
  Administered 2016-12-06 – 2016-12-10 (×7): 400 mg via ORAL
  Filled 2016-12-06 (×7): qty 2

## 2016-12-06 MED ORDER — METHOCARBAMOL 500 MG PO TABS
500.0000 mg | ORAL_TABLET | Freq: Four times a day (QID) | ORAL | Status: DC | PRN
  Administered 2016-12-06 – 2016-12-09 (×6): 500 mg via ORAL
  Filled 2016-12-06 (×6): qty 1

## 2016-12-06 MED ORDER — CALCIUM CARBONATE ANTACID 500 MG PO CHEW
1.0000 | CHEWABLE_TABLET | Freq: Three times a day (TID) | ORAL | Status: DC
  Administered 2016-12-06: 200 mg via ORAL
  Filled 2016-12-06 (×7): qty 1

## 2016-12-06 NOTE — Progress Notes (Signed)
Pt temp 101.108F.  States he feels terrible, he is refusing MRI at this time.  PRN tylenol administered and IS use encouraged.  Drs Blake DivineAkula and Ninetta LightsHatcher both aware.

## 2016-12-06 NOTE — Progress Notes (Signed)
  Echocardiogram 2D Echocardiogram has been performed.  Larry SavoyCasey N Jonel Best 12/06/2016, 11:15 AM

## 2016-12-06 NOTE — Progress Notes (Signed)
Pt provided and educated on the usage of incentive spirometer; pt educated on the benefits of IS and encouraged to use especially with him spiking a fever as well as getting out of bed to ambulate more on the unit. Dionne BucyP. Amo Kelliann Pendergraph RN

## 2016-12-06 NOTE — Progress Notes (Signed)
Patient walked down the hall with staff on standby. Will continue to monitor.

## 2016-12-06 NOTE — Progress Notes (Signed)
PROGRESS NOTE    Larry HartCharles Z Hibbard  ZOX:[id number] DOB: 07/08/1986 DOA: 12/03/2016 [facility]    Brief Narrative:  Larry Best is a 30 y.o. male with  Hx of IVDU , reports a mechanical fall a week ago, comes back with worsening back pain, associated with fevers and chills  Found to have with MSSA bacteremia and epidural abscess with compression, s/p laminectomies .  MRI shows  lumbar diskitis,  Possible vertebral osteomyelitis.  Mri Brain ordered as he reports having headaches.   Assessment & Plan:   Principal Problem:   Spinal epidural abscess Active Problems:   Abscess in epidural space of lumbar spine   IVDU (intravenous drug user)   Staphylococcus aureus bacteremia   MSSA (methicillin susceptible Staphylococcus aureus) infection   Headache   Pyomyositis   MSSA bacteremia ,  With epidural abscess, vertebral osteomyelitis: Started on IV cefazolin. Continue to monitor. Appreciate ID and neurosurgery input. Echocardiogram shows Wall thickness was increased in a pattern of mild LVH. Systolic function was normal. The estimated ejection fraction was in the range of 60% to 65%. Wall motion was normal; there were no regional wall motion  abnormalities. Left ventricular diastolic function parameters  were normal. No obvious valvular vegetations. ? TEE.  Repeat blood cultures ordered and pending.  Pain control, robaxin.  PT/ot eval. Neurosurgery recommending outpatient follow up with Dr Wynetta Emeryram in the office in 10 days.   Mild normocytic anemia:  Stable.   Headache: improved. MRI Brain ordered and pain control. No nausea, or vomiting.    h/o IVDU: Hepatitis panel pending.  HIV antibody is negative.    Leukocytosis: - improving. Rpt WBC count in am.   Persistent fevers' possibly from the epidural abscess.   DVT prophylaxis: SCD'S Code Status: (Full) Family Communication: none at bedside.  Disposition Plan: pending further eval.     Consultants:  Infectious disease.  Neurosurgery.     Procedures:  : Bilateral and Complete laminectomies of L4, L5, and S1 with medial facetectomies at L4-5 L5-S1 and foraminotomies of the L4, L5, S1, and S2 nerve roots on 12/22   Antimicrobials: cefazolin. 12/22   Subjective: Headache is better.   Objective: Vitals:   12/05/16 2101 12/06/16 0124 12/06/16 0527 12/06/16 0938  BP: 133/81 (!) 149/72 132/71 (!) 147/67  Pulse: (!) 110 (!) 123 (!) 114 (!) 111  Resp: 20 20 20 20   Temp: 99.1 F (37.3 C) (!) 101.2 F (38.4 C) 98.3 F (36.8 C) (!) 101.8 F (38.8 C)  TempSrc: Oral Oral Axillary Oral  SpO2: 97% 99% 96% 98%  Weight:      Height:        Intake/Output Summary (Last 24 hours) at 12/06/16 1511 Last data filed at 12/06/16 1300  Gross per 24 hour  Intake             1260 ml  Output             1710 ml  Net             -450 ml   Filed Weights   12/03/16 1445  Weight: 100.7 kg (222 lb)    Examination:  General exam: restless but appears comfortable. Marland Kitchen.  Respiratory system: Clear to auscultation. Respiratory effort normal. Cardiovascular system: S1 & S2 heard, tachycardia.  No JVD, murmurs, rubs, gallops or clicks. No pedal edema. Gastrointestinal system: Abdomen is nondistended, soft and nontender. No organomegaly or masses felt. Normal bowel sounds heard. Central nervous  system: Alert and oriented. No focal deficits. .  Extremities: no pedal edema.  Skin: No rashes, lesions or ulcers     Data Reviewed: I have personally reviewed following labs and imaging studies  CBC:  Recent Labs Lab 12/03/16 1553 12/04/16 0459  WBC 18.5* 17.9*  NEUTROABS 13.8* 12.7*  HGB 11.8* 11.3*  HCT 35.6* 33.9*  MCV 88.6 88.7  PLT 229 228   Basic Metabolic Panel:  Recent Labs Lab 12/03/16 1553 12/04/16 0459  NA 136 137  K 3.5 3.7  CL 102 100*  CO2 25 26  GLUCOSE 91 100*  BUN 9 5*  CREATININE 0.76 0.86  CALCIUM 8.2* 8.1*   GFR: Estimated Creatinine  Clearance: 148.3 mL/min (by C-G formula based on SCr of 0.86 mg/dL). Liver Function Tests:  Recent Labs Lab 12/04/16 0459  AST 23  ALT 33  ALKPHOS 143*  BILITOT 0.5  PROT 5.7*  ALBUMIN 2.2*   No results for input(s): LIPASE, AMYLASE in the last 168 hours. No results for input(s): AMMONIA in the last 168 hours. Coagulation Profile:  Recent Labs Lab 12/03/16 1553  INR 0.98   Cardiac Enzymes: No results for input(s): CKTOTAL, CKMB, CKMBINDEX, TROPONINI in the last 168 hours. BNP (last 3 results) No results for input(s): PROBNP in the last 8760 hours. HbA1C: No results for input(s): HGBA1C in the last 72 hours. CBG: No results for input(s): GLUCAP in the last 168 hours. Lipid Profile: No results for input(s): CHOL, HDL, LDLCALC, TRIG, CHOLHDL, LDLDIRECT in the last 72 hours. Thyroid Function Tests: No results for input(s): TSH, T4TOTAL, FREET4, T3FREE, THYROIDAB in the last 72 hours. Anemia Panel: No results for input(s): VITAMINB12, FOLATE, FERRITIN, TIBC, IRON, RETICCTPCT in the last 72 hours. Sepsis Labs:  Recent Labs Lab 12/03/16 1629  LATICACIDVEN 1.18    Recent Results (from the past 240 hour(s))  Blood culture (routine x 2)     Status: Abnormal   Collection Time: 12/03/16  3:48 PM  Result Value Ref Range Status   Specimen Description BLOOD RIGHT ANTECUBITAL  Final   Special Requests BOTTLES DRAWN AEROBIC AND ANAEROBIC 5CC  Final   Culture  Setup Time   Final    GRAM POSITIVE COCCI IN CLUSTERS IN BOTH AEROBIC AND ANAEROBIC BOTTLES CRITICAL VALUE NOTED.  VALUE IS CONSISTENT WITH PREVIOUSLY REPORTED AND CALLED VALUE.    Culture (A)  Final    STAPHYLOCOCCUS AUREUS SUSCEPTIBILITIES PERFORMED ON PREVIOUS CULTURE WITHIN THE LAST 5 DAYS.    Report Status 12/06/2016 FINAL  Final  Blood culture (routine x 2)     Status: Abnormal   Collection Time: 12/03/16  3:56 PM  Result Value Ref Range Status   Specimen Description BLOOD RIGHT HAND  Final   Special Requests  BOTTLES DRAWN AEROBIC AND ANAEROBIC 5CC  Final   Culture  Setup Time   Final    GRAM POSITIVE COCCI IN CLUSTERS IN BOTH AEROBIC AND ANAEROBIC BOTTLES CRITICAL RESULT CALLED TO, READ BACK BY AND VERIFIED WITH: TONYA JOHNSON,PHARMD @0724  12/04/16 MKELLY,MLT    Culture STAPHYLOCOCCUS AUREUS (A)  Final   Report Status 12/06/2016 FINAL  Final   Organism ID, Bacteria STAPHYLOCOCCUS AUREUS  Final      Susceptibility   Staphylococcus aureus - MIC*    CIPROFLOXACIN <=0.5 SENSITIVE Sensitive     ERYTHROMYCIN <=0.25 SENSITIVE Sensitive     GENTAMICIN <=0.5 SENSITIVE Sensitive     OXACILLIN <=0.25 SENSITIVE Sensitive     TETRACYCLINE <=1 SENSITIVE Sensitive     VANCOMYCIN <=  0.5 SENSITIVE Sensitive     TRIMETH/SULFA <=10 SENSITIVE Sensitive     CLINDAMYCIN <=0.25 SENSITIVE Sensitive     RIFAMPIN <=0.5 SENSITIVE Sensitive     Inducible Clindamycin NEGATIVE Sensitive     * STAPHYLOCOCCUS AUREUS  Blood Culture ID Panel (Reflexed)     Status: Abnormal   Collection Time: 12/03/16  3:56 PM  Result Value Ref Range Status   Enterococcus species NOT DETECTED NOT DETECTED Final   Listeria monocytogenes NOT DETECTED NOT DETECTED Final   Staphylococcus species DETECTED (A) NOT DETECTED Final    Comment: CRITICAL RESULT CALLED TO, READ BACK BY AND VERIFIED WITH: Renard HamperONYA JOHNSON, PHARMD @0724  12/04/16 MKELLY,MLT    Staphylococcus aureus DETECTED (A) NOT DETECTED Final    Comment: CRITICAL RESULT CALLED TO, READ BACK BY AND VERIFIED WITH: Renard HamperONYA JOHNSON, PHARMD @0724  12/04/16 MKELLY,MLT    Methicillin resistance NOT DETECTED NOT DETECTED Final   Streptococcus species NOT DETECTED NOT DETECTED Final   Streptococcus agalactiae NOT DETECTED NOT DETECTED Final   Streptococcus pneumoniae NOT DETECTED NOT DETECTED Final   Streptococcus pyogenes NOT DETECTED NOT DETECTED Final   Acinetobacter baumannii NOT DETECTED NOT DETECTED Final   Enterobacteriaceae species NOT DETECTED NOT DETECTED Final   Enterobacter  cloacae complex NOT DETECTED NOT DETECTED Final   Escherichia coli NOT DETECTED NOT DETECTED Final   Klebsiella oxytoca NOT DETECTED NOT DETECTED Final   Klebsiella pneumoniae NOT DETECTED NOT DETECTED Final   Proteus species NOT DETECTED NOT DETECTED Final   Serratia marcescens NOT DETECTED NOT DETECTED Final   Haemophilus influenzae NOT DETECTED NOT DETECTED Final   Neisseria meningitidis NOT DETECTED NOT DETECTED Final   Pseudomonas aeruginosa NOT DETECTED NOT DETECTED Final   Candida albicans NOT DETECTED NOT DETECTED Final   Candida glabrata NOT DETECTED NOT DETECTED Final   Candida krusei NOT DETECTED NOT DETECTED Final   Candida parapsilosis NOT DETECTED NOT DETECTED Final   Candida tropicalis NOT DETECTED NOT DETECTED Final  Aerobic/Anaerobic Culture (surgical/deep wound)     Status: None (Preliminary result)   Collection Time: 12/03/16  7:14 PM  Result Value Ref Range Status   Specimen Description WOUND  Final   Special Requests SWAB OF LEFT PARASPINAL PT ON ANCEF VANC SAMPLE A  Final   Gram Stain   Final    RARE WBC PRESENT,BOTH PMN AND MONONUCLEAR FEW GRAM POSITIVE COCCI IN CLUSTERS    Culture   Final    ABUNDANT STAPHYLOCOCCUS AUREUS NO ANAEROBES ISOLATED; CULTURE IN PROGRESS FOR 5 DAYS    Report Status PENDING  Incomplete   Organism ID, Bacteria STAPHYLOCOCCUS AUREUS  Final      Susceptibility   Staphylococcus aureus - MIC*    CIPROFLOXACIN <=0.5 SENSITIVE Sensitive     ERYTHROMYCIN 0.5 SENSITIVE Sensitive     GENTAMICIN <=0.5 SENSITIVE Sensitive     OXACILLIN <=0.25 SENSITIVE Sensitive     TETRACYCLINE <=1 SENSITIVE Sensitive     VANCOMYCIN <=0.5 SENSITIVE Sensitive     TRIMETH/SULFA <=10 SENSITIVE Sensitive     CLINDAMYCIN <=0.25 SENSITIVE Sensitive     RIFAMPIN <=0.5 SENSITIVE Sensitive     Inducible Clindamycin NEGATIVE Sensitive     * ABUNDANT STAPHYLOCOCCUS AUREUS  Aerobic/Anaerobic Culture (surgical/deep wound)     Status: None (Preliminary result)    Collection Time: 12/03/16  7:16 PM  Result Value Ref Range Status   Specimen Description WOUND  Final   Special Requests LEFT PARASPINAL SWAB PT ON VANC ANCEF SAMPLE B  Final   Gram  Stain   Final    MODERATE WBC PRESENT,BOTH PMN AND MONONUCLEAR MODERATE GRAM POSITIVE COCCI IN CLUSTERS    Culture   Final    ABUNDANT STAPHYLOCOCCUS AUREUS SUSCEPTIBILITIES PERFORMED ON PREVIOUS CULTURE WITHIN THE LAST 5 DAYS. NO ANAEROBES ISOLATED; CULTURE IN PROGRESS FOR 5 DAYS    Report Status PENDING  Incomplete  Aerobic/Anaerobic Culture (surgical/deep wound)     Status: None (Preliminary result)   Collection Time: 12/03/16  7:21 PM  Result Value Ref Range Status   Specimen Description WOUND  Final   Special Requests LEFT LUMBAR FACET 4,5 PT ON VANC ANCEF SAMPLE C  Final   Gram Stain   Final    FEW WBC PRESENT, PREDOMINANTLY PMN RARE GRAM POSITIVE COCCI IN PAIRS    Culture   Final    FEW STAPHYLOCOCCUS AUREUS SUSCEPTIBILITIES PERFORMED ON PREVIOUS CULTURE WITHIN THE LAST 5 DAYS. NO ANAEROBES ISOLATED; CULTURE IN PROGRESS FOR 5 DAYS    Report Status PENDING  Incomplete  Aerobic/Anaerobic Culture (surgical/deep wound)     Status: None (Preliminary result)   Collection Time: 12/03/16  7:28 PM  Result Value Ref Range Status   Specimen Description WOUND  Final   Special Requests   Final    LEFT LUMBAR FOUR EPIDURAL SPACE PT ON VANC ANCEF SAMPLE D   Gram Stain   Final    RARE WBC PRESENT,BOTH PMN AND MONONUCLEAR RARE GRAM POSITIVE COCCI IN CLUSTERS    Culture   Final    ABUNDANT STAPHYLOCOCCUS AUREUS SUSCEPTIBILITIES PERFORMED ON PREVIOUS CULTURE WITHIN THE LAST 5 DAYS. NO ANAEROBES ISOLATED; CULTURE IN PROGRESS FOR 5 DAYS    Report Status PENDING  Incomplete  Culture, blood (Routine X 2) w Reflex to ID Panel     Status: None (Preliminary result)   Collection Time: 12/04/16  2:10 PM  Result Value Ref Range Status   Specimen Description BLOOD LEFT ARM  Final   Special Requests BOTTLES DRAWN  AEROBIC AND ANAEROBIC 5CC  Final   Culture NO GROWTH < 24 HOURS  Final   Report Status PENDING  Incomplete  Culture, blood (Routine X 2) w Reflex to ID Panel     Status: None (Preliminary result)   Collection Time: 12/04/16  2:20 PM  Result Value Ref Range Status   Specimen Description BLOOD LEFT HAND  Final   Special Requests BOTTLES DRAWN AEROBIC AND ANAEROBIC 5CC  Final   Culture NO GROWTH < 24 HOURS  Final   Report Status PENDING  Incomplete         Radiology Studies: No results found.      Scheduled Meds: . calcium carbonate  1 tablet Oral TID WC  .  ceFAZolin (ANCEF) IV  2 g Intravenous Q8H  . diazepam  5 mg Intravenous Once  . LORazepam  2 mg Oral UD  . nicotine  21 mg Transdermal Daily  . sodium chloride flush  3 mL Intravenous Q12H   Continuous Infusions: . sodium chloride       LOS: 3 days    Time spent: 35 minutes.     Kathlen Mody, MD [facility] Pager [phone]  If 7PM-7AM, please contact night-coverage [email or web address] Password [email or web address] 12/06/2016, 3:11 PM

## 2016-12-06 NOTE — Progress Notes (Signed)
Vitals:   12/05/16 2101 12/06/16 0124 12/06/16 0527 12/06/16 0938  BP: 133/81 (!) 149/72 132/71 (!) 147/67  Pulse: (!) 110 (!) 123 (!) 114 (!) 111  Resp: 20 20 20 20   Temp: 99.1 F (37.3 C) (!) 101.2 F (38.4 C) 98.3 F (36.8 C) (!) 101.8 F (38.8 C)  TempSrc: Oral Oral Axillary Oral  SpO2: 97% 99% 96% 98%  Weight:      Height:        CBC  Recent Labs  12/03/16 1553 12/04/16 0459  WBC 18.5* 17.9*  HGB 11.8* 11.3*  HCT 35.6* 33.9*  PLT 229 228   BMET  Recent Labs  12/03/16 1553 12/04/16 0459  NA 136 137  K 3.5 3.7  CL 102 100*  CO2 25 26  GLUCOSE 91 100*  BUN 9 5*  CREATININE 0.76 0.86  CALCIUM 8.2* 8.1*    Patient up and ambulating in the room and hallways. Drainage into Hemovac of only 10 mL of serous fluid over the past 24 hours. Drain removed, and dressing applied. Encouraged to ambulate in the halls at least 6 times per day. Will need to follow-up with Dr. Wynetta Emeryram in the office in about a week and a half.  Plan: Continues on IV antibiotics per internal medicine and infectious disease services. Doing well from a surgical perspective.  Hewitt ShortsNUDELMAN,ROBERT W, MD 12/06/2016, 11:29 AM

## 2016-12-06 NOTE — Progress Notes (Signed)
Pt refused his MRI. York SpanielSaid he wants to wait to tomorrow or Tuesday cos it's too late to go tonight. MD notified and aware. Ordered valium wasted in sink with second RN; not given. Arabella MerlesP. Amo Brenner Visconti RN.

## 2016-12-07 ENCOUNTER — Encounter (HOSPITAL_COMMUNITY): Payer: Self-pay

## 2016-12-07 DIAGNOSIS — B9561 Methicillin susceptible Staphylococcus aureus infection as the cause of diseases classified elsewhere: Secondary | ICD-10-CM

## 2016-12-07 DIAGNOSIS — R768 Other specified abnormal immunological findings in serum: Secondary | ICD-10-CM

## 2016-12-07 DIAGNOSIS — G061 Intraspinal abscess and granuloma: Principal | ICD-10-CM

## 2016-12-07 LAB — BASIC METABOLIC PANEL
Anion gap: 10 (ref 5–15)
BUN: 6 mg/dL (ref 6–20)
CALCIUM: 8.5 mg/dL — AB (ref 8.9–10.3)
CHLORIDE: 99 mmol/L — AB (ref 101–111)
CO2: 28 mmol/L (ref 22–32)
CREATININE: 0.83 mg/dL (ref 0.61–1.24)
GFR calc Af Amer: >60 mL/min (ref >60)
GFR calc non Af Amer: >60 mL/min (ref >60)
Glucose, Bld: 117 mg/dL — ABNORMAL HIGH (ref 65–99)
Potassium: 4.4 mmol/L (ref 3.5–5.1)
SODIUM: 137 mmol/L (ref 135–145)

## 2016-12-07 LAB — HEPATITIS B SURFACE ANTIGEN: Hepatitis B Surface Ag: NEGATIVE

## 2016-12-07 LAB — CBC
HCT: 31.7 % — ABNORMAL LOW (ref 39.0–52.0)
HEMOGLOBIN: 10.6 g/dL — AB (ref 13.0–17.0)
MCH: 29.5 pg (ref 26.0–34.0)
MCHC: 33.4 g/dL (ref 30.0–36.0)
MCV: 88.3 fL (ref 78.0–100.0)
Platelets: 330 10*3/uL (ref 150–400)
RBC: 3.59 MIL/uL — ABNORMAL LOW (ref 4.22–5.81)
RDW: 14.4 % (ref 11.5–15.5)
WBC: 19.6 10*3/uL — ABNORMAL HIGH (ref 4.0–10.5)

## 2016-12-07 LAB — COMMENT2 - HEP PANEL

## 2016-12-07 LAB — HEPATITIS C ANTIBODY (REFLEX): HCV Ab: >11 s/co ratio — ABNORMAL HIGH (ref 0.0–0.9)

## 2016-12-07 NOTE — Progress Notes (Signed)
PT Cancellation Note  Patient Details Name: Larry Best MRN: [id number] DOB: 04/22/1986   Cancelled Treatment:    Reason Eval/Treat Not Completed: Pain limiting ability to participate. Pt refusing to participate with therapy at this time. Asking for a hot pack for pain. Advised that ice would be preferable around the surgical site but pt declines ice pack. Encouraged OOB to chair if able to tolerate. Pt states that he has been walking the unit. Will check back as schedule allows to continue with PT POC.    Marylynn PearsonLaura D Amylah Will 12/07/2016, 8:15 AM   Conni SlipperLaura Bridger Pizzi, PT, DPT Acute Rehabilitation Services Pager: [phone]

## 2016-12-07 NOTE — Progress Notes (Signed)
Vitals:   12/06/16 2118 12/07/16 0034 12/07/16 0451 12/07/16 0503  BP: (!) 146/63 (!) 114/54 136/62   Pulse: (!) 102 95 (!) 107   Resp: 18 18 18    Temp: 100.2 F (37.9 C) 98.6 F (37 C) 99 F (37.2 C)   TempSrc: Oral Oral Oral   SpO2: 99% 95% 96%   Weight:    108 kg (238 lb)  Height:        CBC  Recent Labs  12/07/16 0743  WBC 19.6*  HGB 10.6*  HCT 31.7*  PLT 330   BMET  Recent Labs  12/07/16 0743  NA 137  K 4.4  CL 99*  CO2 28  GLUCOSE 117*  BUN 6  CREATININE 0.83  CALCIUM 8.5*    Patient resting in bed, however he has been up and ambulating.  Still significant leukocytosis. Continues on IV antibiotics. We'll have nursing staff remove dressing tomorrow morning. For follow-up with Dr. Wynetta Emeryram in the office in about a week and a half.  Plan: Continues on IV antibiotics. Encouraged to ambulate.  Hewitt ShortsNUDELMAN,ROBERT W, MD 12/07/2016, 8:56 AM

## 2016-12-07 NOTE — Progress Notes (Signed)
Removed dressing on back, dry and intact with no drainage noted at this time

## 2016-12-07 NOTE — Progress Notes (Addendum)
INFECTIOUS DISEASE PROGRESS NOTE  ID: Larry Best is a 30 y.o. male with  Principal Problem:   Spinal epidural abscess Active Problems:   Abscess in epidural space of lumbar spine   IVDU (intravenous drug user)   Staphylococcus aureus bacteremia   MSSA (methicillin susceptible Staphylococcus aureus) infection   Headache   Pyomyositis  Subjective: Fever o/n C/o back pain.   Abtx:  Anti-infectives    Start     Dose/Rate Route Frequency Ordered Stop   12/05/16 0500  ceFAZolin (ANCEF) IVPB 2g/100 mL premix     2 g 200 mL/hr over 30 Minutes Intravenous Every 8 hours 12/04/16 2204     12/04/16 1730  ceFAZolin (ANCEF) IVPB 2g/100 mL premix  Status:  Discontinued     2 g 200 mL/hr over 30 Minutes Intravenous Every 8 hours 12/04/16 1432 12/04/16 2204   12/04/16 0300  vancomycin (VANCOCIN) 1,250 mg in sodium chloride 0.9 % 250 mL IVPB  Status:  Discontinued     1,250 mg 166.7 mL/hr over 90 Minutes Intravenous Every 8 hours 12/03/16 2330 12/04/16 0934   12/03/16 2359  ceFAZolin (ANCEF) IVPB 2g/100 mL premix     2 g 200 mL/hr over 30 Minutes Intravenous Every 8 hours 12/03/16 2245 12/04/16 0958   12/03/16 1946  vancomycin (VANCOCIN) powder  Status:  Discontinued       As needed 12/03/16 1947 12/03/16 2043   12/03/16 1855  50,000 units bacitracin in 0.9% normal saline 250 mL irrigation  Status:  Discontinued       As needed 12/03/16 1855 12/03/16 2043      Medications:  Scheduled: . calcium carbonate  1 tablet Oral TID WC  .  ceFAZolin (ANCEF) IV  2 g Intravenous Q8H  . LORazepam  2 mg Oral UD  . nicotine  21 mg Transdermal Daily  . sodium chloride flush  3 mL Intravenous Q12H    Objective: Vital signs in last 24 hours: Temp:  [98.6 F (37 C)-103.2 F (39.6 C)] 101.3 F (38.5 C) (12/26 0930) Pulse Rate:  [95-130] 116 (12/26 0930) Resp:  [18] 18 (12/26 0930) BP: (114-146)/(54-66) 138/59 (12/26 0930) SpO2:  [93 %-99 %] 93 % (12/26 0930) Weight:  [108 kg (238 lb)]  108 kg (238 lb) (12/26 0503)   General appearance: alert, cooperative and no distress Back: lower back wound with some crusting.  Resp: clear to auscultation bilaterally Cardio: regular rate and rhythm GI: normal findings: bowel sounds normal and soft, non-tender Extremities: edema none  Lab Results  Recent Labs  12/07/16 0743  WBC 19.6*  HGB 10.6*  HCT 31.7*  NA 137  K 4.4  CL 99*  CO2 28  BUN 6  CREATININE 0.83   Liver Panel No results for input(s): PROT, ALBUMIN, AST, ALT, ALKPHOS, BILITOT, BILIDIR, IBILI in the last 72 hours. Sedimentation Rate No results for input(s): ESRSEDRATE in the last 72 hours. C-Reactive Protein No results for input(s): CRP in the last 72 hours.  Microbiology: Recent Results (from the past 240 hour(s))  Blood culture (routine x 2)     Status: Abnormal   Collection Time: 12/03/16  3:48 PM  Result Value Ref Range Status   Specimen Description BLOOD RIGHT ANTECUBITAL  Final   Special Requests BOTTLES DRAWN AEROBIC AND ANAEROBIC 5CC  Final   Culture  Setup Time   Final    GRAM POSITIVE COCCI IN CLUSTERS IN BOTH AEROBIC AND ANAEROBIC BOTTLES CRITICAL VALUE NOTED.  VALUE IS CONSISTENT WITH  PREVIOUSLY REPORTED AND CALLED VALUE.    Culture (A)  Final    STAPHYLOCOCCUS AUREUS SUSCEPTIBILITIES PERFORMED ON PREVIOUS CULTURE WITHIN THE LAST 5 DAYS.    Report Status 12/06/2016 FINAL  Final  Blood culture (routine x 2)     Status: Abnormal   Collection Time: 12/03/16  3:56 PM  Result Value Ref Range Status   Specimen Description BLOOD RIGHT HAND  Final   Special Requests BOTTLES DRAWN AEROBIC AND ANAEROBIC 5CC  Final   Culture  Setup Time   Final    GRAM POSITIVE COCCI IN CLUSTERS IN BOTH AEROBIC AND ANAEROBIC BOTTLES CRITICAL RESULT CALLED TO, READ BACK BY AND VERIFIED WITH: TONYA JOHNSON,PHARMD @0724  12/04/16 MKELLY,MLT    Culture STAPHYLOCOCCUS AUREUS (A)  Final   Report Status 12/06/2016 FINAL  Final   Organism ID, Bacteria  STAPHYLOCOCCUS AUREUS  Final      Susceptibility   Staphylococcus aureus - MIC*    CIPROFLOXACIN <=0.5 SENSITIVE Sensitive     ERYTHROMYCIN <=0.25 SENSITIVE Sensitive     GENTAMICIN <=0.5 SENSITIVE Sensitive     OXACILLIN <=0.25 SENSITIVE Sensitive     TETRACYCLINE <=1 SENSITIVE Sensitive     VANCOMYCIN <=0.5 SENSITIVE Sensitive     TRIMETH/SULFA <=10 SENSITIVE Sensitive     CLINDAMYCIN <=0.25 SENSITIVE Sensitive     RIFAMPIN <=0.5 SENSITIVE Sensitive     Inducible Clindamycin NEGATIVE Sensitive     * STAPHYLOCOCCUS AUREUS  Blood Culture ID Panel (Reflexed)     Status: Abnormal   Collection Time: 12/03/16  3:56 PM  Result Value Ref Range Status   Enterococcus species NOT DETECTED NOT DETECTED Final   Listeria monocytogenes NOT DETECTED NOT DETECTED Final   Staphylococcus species DETECTED (A) NOT DETECTED Final    Comment: CRITICAL RESULT CALLED TO, READ BACK BY AND VERIFIED WITH: Renard Hamper, PHARMD @0724  12/04/16 MKELLY,MLT    Staphylococcus aureus DETECTED (A) NOT DETECTED Final    Comment: CRITICAL RESULT CALLED TO, READ BACK BY AND VERIFIED WITH: Renard Hamper, PHARMD @0724  12/04/16 MKELLY,MLT    Methicillin resistance NOT DETECTED NOT DETECTED Final   Streptococcus species NOT DETECTED NOT DETECTED Final   Streptococcus agalactiae NOT DETECTED NOT DETECTED Final   Streptococcus pneumoniae NOT DETECTED NOT DETECTED Final   Streptococcus pyogenes NOT DETECTED NOT DETECTED Final   Acinetobacter baumannii NOT DETECTED NOT DETECTED Final   Enterobacteriaceae species NOT DETECTED NOT DETECTED Final   Enterobacter cloacae complex NOT DETECTED NOT DETECTED Final   Escherichia coli NOT DETECTED NOT DETECTED Final   Klebsiella oxytoca NOT DETECTED NOT DETECTED Final   Klebsiella pneumoniae NOT DETECTED NOT DETECTED Final   Proteus species NOT DETECTED NOT DETECTED Final   Serratia marcescens NOT DETECTED NOT DETECTED Final   Haemophilus influenzae NOT DETECTED NOT DETECTED Final     Neisseria meningitidis NOT DETECTED NOT DETECTED Final   Pseudomonas aeruginosa NOT DETECTED NOT DETECTED Final   Candida albicans NOT DETECTED NOT DETECTED Final   Candida glabrata NOT DETECTED NOT DETECTED Final   Candida krusei NOT DETECTED NOT DETECTED Final   Candida parapsilosis NOT DETECTED NOT DETECTED Final   Candida tropicalis NOT DETECTED NOT DETECTED Final  Aerobic/Anaerobic Culture (surgical/deep wound)     Status: None (Preliminary result)   Collection Time: 12/03/16  7:14 PM  Result Value Ref Range Status   Specimen Description WOUND  Final   Special Requests SWAB OF LEFT PARASPINAL PT ON ANCEF VANC SAMPLE A  Final   Gram Stain   Final  RARE WBC PRESENT,BOTH PMN AND MONONUCLEAR FEW GRAM POSITIVE COCCI IN CLUSTERS    Culture   Final    ABUNDANT STAPHYLOCOCCUS AUREUS NO ANAEROBES ISOLATED; CULTURE IN PROGRESS FOR 5 DAYS    Report Status PENDING  Incomplete   Organism ID, Bacteria STAPHYLOCOCCUS AUREUS  Final      Susceptibility   Staphylococcus aureus - MIC*    CIPROFLOXACIN <=0.5 SENSITIVE Sensitive     ERYTHROMYCIN 0.5 SENSITIVE Sensitive     GENTAMICIN <=0.5 SENSITIVE Sensitive     OXACILLIN <=0.25 SENSITIVE Sensitive     TETRACYCLINE <=1 SENSITIVE Sensitive     VANCOMYCIN <=0.5 SENSITIVE Sensitive     TRIMETH/SULFA <=10 SENSITIVE Sensitive     CLINDAMYCIN <=0.25 SENSITIVE Sensitive     RIFAMPIN <=0.5 SENSITIVE Sensitive     Inducible Clindamycin NEGATIVE Sensitive     * ABUNDANT STAPHYLOCOCCUS AUREUS  Aerobic/Anaerobic Culture (surgical/deep wound)     Status: None (Preliminary result)   Collection Time: 12/03/16  7:16 PM  Result Value Ref Range Status   Specimen Description WOUND  Final   Special Requests LEFT PARASPINAL SWAB PT ON VANC ANCEF SAMPLE B  Final   Gram Stain   Final    MODERATE WBC PRESENT,BOTH PMN AND MONONUCLEAR MODERATE GRAM POSITIVE COCCI IN CLUSTERS    Culture   Final    ABUNDANT STAPHYLOCOCCUS AUREUS SUSCEPTIBILITIES PERFORMED ON  PREVIOUS CULTURE WITHIN THE LAST 5 DAYS. NO ANAEROBES ISOLATED; CULTURE IN PROGRESS FOR 5 DAYS    Report Status PENDING  Incomplete  Aerobic/Anaerobic Culture (surgical/deep wound)     Status: None (Preliminary result)   Collection Time: 12/03/16  7:21 PM  Result Value Ref Range Status   Specimen Description WOUND  Final   Special Requests LEFT LUMBAR FACET 4,5 PT ON VANC ANCEF SAMPLE C  Final   Gram Stain   Final    FEW WBC PRESENT, PREDOMINANTLY PMN RARE GRAM POSITIVE COCCI IN PAIRS    Culture   Final    FEW STAPHYLOCOCCUS AUREUS SUSCEPTIBILITIES PERFORMED ON PREVIOUS CULTURE WITHIN THE LAST 5 DAYS. NO ANAEROBES ISOLATED; CULTURE IN PROGRESS FOR 5 DAYS    Report Status PENDING  Incomplete  Aerobic/Anaerobic Culture (surgical/deep wound)     Status: None (Preliminary result)   Collection Time: 12/03/16  7:28 PM  Result Value Ref Range Status   Specimen Description WOUND  Final   Special Requests   Final    LEFT LUMBAR FOUR EPIDURAL SPACE PT ON VANC ANCEF SAMPLE D   Gram Stain   Final    RARE WBC PRESENT,BOTH PMN AND MONONUCLEAR RARE GRAM POSITIVE COCCI IN CLUSTERS    Culture   Final    ABUNDANT STAPHYLOCOCCUS AUREUS SUSCEPTIBILITIES PERFORMED ON PREVIOUS CULTURE WITHIN THE LAST 5 DAYS. NO ANAEROBES ISOLATED; CULTURE IN PROGRESS FOR 5 DAYS    Report Status PENDING  Incomplete  Culture, blood (Routine X 2) w Reflex to ID Panel     Status: None (Preliminary result)   Collection Time: 12/04/16  2:10 PM  Result Value Ref Range Status   Specimen Description BLOOD LEFT ARM  Final   Special Requests BOTTLES DRAWN AEROBIC AND ANAEROBIC 5CC  Final   Culture NO GROWTH 3 DAYS  Final   Report Status PENDING  Incomplete  Culture, blood (Routine X 2) w Reflex to ID Panel     Status: None (Preliminary result)   Collection Time: 12/04/16  2:20 PM  Result Value Ref Range Status   Specimen Description BLOOD LEFT HAND  Final   Special Requests BOTTLES DRAWN AEROBIC AND ANAEROBIC 5CC  Final    Culture NO GROWTH 3 DAYS  Final   Report Status PENDING  Incomplete  Culture, blood (Routine X 2) w Reflex to ID Panel     Status: None (Preliminary result)   Collection Time: 12/06/16  3:25 PM  Result Value Ref Range Status   Specimen Description BLOOD LEFT ANTECUBITAL  Final   Special Requests BOTTLES DRAWN AEROBIC ONLY 5ML  Final   Culture NO GROWTH < 24 HOURS  Final   Report Status PENDING  Incomplete  Culture, blood (Routine X 2) w Reflex to ID Panel     Status: None (Preliminary result)   Collection Time: 12/06/16  3:29 PM  Result Value Ref Range Status   Specimen Description BLOOD LEFT ANTECUBITAL  Final   Special Requests BOTTLES DRAWN AEROBIC ONLY  5ML  Final   Culture NO GROWTH < 24 HOURS  Final   Report Status PENDING  Incomplete    Studies/Results: No results found.   Assessment/Plan: Epidural Abscess MSSA bacteremia Fever Hepatitis C Ab+  He needs TEE Repeat BCx are ngtd.  Some concern about placing PIC Asking for pain mgmt Will check Hep C RNA and genotype  Total days of antibiotics: 4 ancef         Larry Best Infectious Diseases (pager) [phone] [email or web address] 12/07/2016, 12:58 PM  LOS: 4 days

## 2016-12-07 NOTE — Progress Notes (Signed)
PROGRESS NOTE    Larry HartCharles Z Lanahan  ZOX:[id number] DOB: 06/19/1986 DOA: 12/03/2016 [facility]    Brief Narrative:  Larry Best is a 30 y.o. male with  Hx of IVDU , reports a mechanical fall a week ago, comes back with worsening back pain, associated with fevers and chills  Found to have with MSSA bacteremia and epidural abscess with compression, s/p laminectomies .  MRI shows  lumbar diskitis,  Possible vertebral osteomyelitis.  Mri Brain ordered as he reports having headaches. Echocardiogram shows LVEF 60-65%, mild LVH, normal wall motion, normal diastolic function, mild MR, mild LAE, normal IVC. No obvious valvular vegetations, however, if pretest probability of endocarditis is high, consider TEE.  Cardiology consulted for TEE.  Assessment & Plan:   Principal Problem:   Spinal epidural abscess Active Problems:   Abscess in epidural space of lumbar spine   IVDU (intravenous drug user)   Staphylococcus aureus bacteremia   MSSA (methicillin susceptible Staphylococcus aureus) infection   Headache   Pyomyositis   MSSA bacteremia ,  With epidural abscess, vertebral osteomyelitis: Started on IV cefazolin. Continue to monitor. Appreciate ID and neurosurgery input. Echocardiogram shows Wall thickness was increased in a pattern of mild LVH. Systolic function was normal. The estimated ejection fraction was in the range of 60% to 65%. Wall motion was normal; there were no regional wall motion  abnormalities. Left ventricular diastolic function parameters  were normal. No obvious valvular vegetations. ? TEE. , cardiology consulted for TEE.  Repeat blood cultures ordered and pending.  Pain control, robaxin.  PT/ot eval. Neurosurgery recommending outpatient follow up with Dr Wynetta Emeryram in the office in 10 days.   Mild normocytic anemia:  Stable.   Headache: improved. MRI Brain ordered and pain control. No nausea, or vomiting.    h/o IVDU: Hepatitis panel  shows hepatitis C antibodies. HCV RNA ordered by ID.  HIV antibody is negative.    Leukocytosis: - improving. Rpt WBC count in am.   Persistent fevers' possibly from the epidural abscess.   DVT prophylaxis: SCD'S Code Status: (Full) Family Communication: none at bedside.  Disposition Plan: pending further eval.    Consultants:  Infectious disease.  Neurosurgery.  Cardiology.     Procedures:  : Bilateral and Complete laminectomies of L4, L5, and S1 with medial facetectomies at L4-5 L5-S1 and foraminotomies of the L4, L5, S1, and S2 nerve roots on 12/22   Antimicrobials: cefazolin. 12/22   Subjective: Headache is better.  No new complaints.   Objective: Vitals:   12/07/16 0503 12/07/16 0930 12/07/16 1334 12/07/16 1715  BP:  (!) 138/59 (!) 112/52 132/76  Pulse:  (!) 116 (!) 103 (!) 106  Resp:  18 18 18   Temp:  (!) 101.3 F (38.5 C) 99.3 F (37.4 C) 98.8 F (37.1 C)  TempSrc:  Oral Oral Oral  SpO2:  93% 92% 93%  Weight: 108 kg (238 lb)     Height:        Intake/Output Summary (Last 24 hours) at 12/07/16 1849 Last data filed at 12/07/16 0500  Gross per 24 hour  Intake             1173 ml  Output             2125 ml  Net             -952 ml   Filed Weights   12/03/16 1445 12/07/16 0503  Weight: 100.7 kg (222 lb) 108 kg (238  lb)    Examination:  General exam: restless but appears comfortable. Marland Kitchen  Respiratory system: Clear to auscultation. Respiratory effort normal. Cardiovascular system: S1 & S2 heard, tachycardia.  No JVD, murmurs, rubs, gallops or clicks. No pedal edema. Gastrointestinal system: Abdomen is nondistended, soft and nontender. No organomegaly or masses felt. Normal bowel sounds heard. Central nervous system: Alert and oriented. No focal deficits. .  Extremities: no pedal edema.  Skin: No rashes, lesions or ulcers     Data Reviewed: I have personally reviewed following labs and imaging studies  CBC:  Recent Labs Lab 12/03/16 1553  12/04/16 0459 12/07/16 0743  WBC 18.5* 17.9* 19.6*  NEUTROABS 13.8* 12.7*  --   HGB 11.8* 11.3* 10.6*  HCT 35.6* 33.9* 31.7*  MCV 88.6 88.7 88.3  PLT 229 228 330   Basic Metabolic Panel:  Recent Labs Lab 12/03/16 1553 12/04/16 0459 12/07/16 0743  NA 136 137 137  K 3.5 3.7 4.4  CL 102 100* 99*  CO2 25 26 28   GLUCOSE 91 100* 117*  BUN 9 5* 6  CREATININE 0.76 0.86 0.83  CALCIUM 8.2* 8.1* 8.5*   GFR: Estimated Creatinine Clearance: 159 mL/min (by C-G formula based on SCr of 0.83 mg/dL). Liver Function Tests:  Recent Labs Lab 12/04/16 0459  AST 23  ALT 33  ALKPHOS 143*  BILITOT 0.5  PROT 5.7*  ALBUMIN 2.2*   No results for input(s): LIPASE, AMYLASE in the last 168 hours. No results for input(s): AMMONIA in the last 168 hours. Coagulation Profile:  Recent Labs Lab 12/03/16 1553  INR 0.98   Cardiac Enzymes: No results for input(s): CKTOTAL, CKMB, CKMBINDEX, TROPONINI in the last 168 hours. BNP (last 3 results) No results for input(s): PROBNP in the last 8760 hours. HbA1C: No results for input(s): HGBA1C in the last 72 hours. CBG: No results for input(s): GLUCAP in the last 168 hours. Lipid Profile: No results for input(s): CHOL, HDL, LDLCALC, TRIG, CHOLHDL, LDLDIRECT in the last 72 hours. Thyroid Function Tests: No results for input(s): TSH, T4TOTAL, FREET4, T3FREE, THYROIDAB in the last 72 hours. Anemia Panel: No results for input(s): VITAMINB12, FOLATE, FERRITIN, TIBC, IRON, RETICCTPCT in the last 72 hours. Sepsis Labs:  Recent Labs Lab 12/03/16 1629  LATICACIDVEN 1.18    Recent Results (from the past 240 hour(s))  Blood culture (routine x 2)     Status: Abnormal   Collection Time: 12/03/16  3:48 PM  Result Value Ref Range Status   Specimen Description BLOOD RIGHT ANTECUBITAL  Final   Special Requests BOTTLES DRAWN AEROBIC AND ANAEROBIC 5CC  Final   Culture  Setup Time   Final    GRAM POSITIVE COCCI IN CLUSTERS IN BOTH AEROBIC AND ANAEROBIC  BOTTLES CRITICAL VALUE NOTED.  VALUE IS CONSISTENT WITH PREVIOUSLY REPORTED AND CALLED VALUE.    Culture (A)  Final    STAPHYLOCOCCUS AUREUS SUSCEPTIBILITIES PERFORMED ON PREVIOUS CULTURE WITHIN THE LAST 5 DAYS.    Report Status 12/06/2016 FINAL  Final  Blood culture (routine x 2)     Status: Abnormal   Collection Time: 12/03/16  3:56 PM  Result Value Ref Range Status   Specimen Description BLOOD RIGHT HAND  Final   Special Requests BOTTLES DRAWN AEROBIC AND ANAEROBIC 5CC  Final   Culture  Setup Time   Final    GRAM POSITIVE COCCI IN CLUSTERS IN BOTH AEROBIC AND ANAEROBIC BOTTLES CRITICAL RESULT CALLED TO, READ BACK BY AND VERIFIED WITH: TONYA JOHNSON,PHARMD @0724  12/04/16 MKELLY,MLT  Culture STAPHYLOCOCCUS AUREUS (A)  Final   Report Status 12/06/2016 FINAL  Final   Organism ID, Bacteria STAPHYLOCOCCUS AUREUS  Final      Susceptibility   Staphylococcus aureus - MIC*    CIPROFLOXACIN <=0.5 SENSITIVE Sensitive     ERYTHROMYCIN <=0.25 SENSITIVE Sensitive     GENTAMICIN <=0.5 SENSITIVE Sensitive     OXACILLIN <=0.25 SENSITIVE Sensitive     TETRACYCLINE <=1 SENSITIVE Sensitive     VANCOMYCIN <=0.5 SENSITIVE Sensitive     TRIMETH/SULFA <=10 SENSITIVE Sensitive     CLINDAMYCIN <=0.25 SENSITIVE Sensitive     RIFAMPIN <=0.5 SENSITIVE Sensitive     Inducible Clindamycin NEGATIVE Sensitive     * STAPHYLOCOCCUS AUREUS  Blood Culture ID Panel (Reflexed)     Status: Abnormal   Collection Time: 12/03/16  3:56 PM  Result Value Ref Range Status   Enterococcus species NOT DETECTED NOT DETECTED Final   Listeria monocytogenes NOT DETECTED NOT DETECTED Final   Staphylococcus species DETECTED (A) NOT DETECTED Final    Comment: CRITICAL RESULT CALLED TO, READ BACK BY AND VERIFIED WITH: Renard Hamper, PHARMD @0724  12/04/16 MKELLY,MLT    Staphylococcus aureus DETECTED (A) NOT DETECTED Final    Comment: CRITICAL RESULT CALLED TO, READ BACK BY AND VERIFIED WITH: Renard Hamper, PHARMD @0724   12/04/16 MKELLY,MLT    Methicillin resistance NOT DETECTED NOT DETECTED Final   Streptococcus species NOT DETECTED NOT DETECTED Final   Streptococcus agalactiae NOT DETECTED NOT DETECTED Final   Streptococcus pneumoniae NOT DETECTED NOT DETECTED Final   Streptococcus pyogenes NOT DETECTED NOT DETECTED Final   Acinetobacter baumannii NOT DETECTED NOT DETECTED Final   Enterobacteriaceae species NOT DETECTED NOT DETECTED Final   Enterobacter cloacae complex NOT DETECTED NOT DETECTED Final   Escherichia coli NOT DETECTED NOT DETECTED Final   Klebsiella oxytoca NOT DETECTED NOT DETECTED Final   Klebsiella pneumoniae NOT DETECTED NOT DETECTED Final   Proteus species NOT DETECTED NOT DETECTED Final   Serratia marcescens NOT DETECTED NOT DETECTED Final   Haemophilus influenzae NOT DETECTED NOT DETECTED Final   Neisseria meningitidis NOT DETECTED NOT DETECTED Final   Pseudomonas aeruginosa NOT DETECTED NOT DETECTED Final   Candida albicans NOT DETECTED NOT DETECTED Final   Candida glabrata NOT DETECTED NOT DETECTED Final   Candida krusei NOT DETECTED NOT DETECTED Final   Candida parapsilosis NOT DETECTED NOT DETECTED Final   Candida tropicalis NOT DETECTED NOT DETECTED Final  Aerobic/Anaerobic Culture (surgical/deep wound)     Status: None (Preliminary result)   Collection Time: 12/03/16  7:14 PM  Result Value Ref Range Status   Specimen Description WOUND  Final   Special Requests SWAB OF LEFT PARASPINAL PT ON ANCEF VANC SAMPLE A  Final   Gram Stain   Final    RARE WBC PRESENT,BOTH PMN AND MONONUCLEAR FEW GRAM POSITIVE COCCI IN CLUSTERS    Culture   Final    ABUNDANT STAPHYLOCOCCUS AUREUS NO ANAEROBES ISOLATED; CULTURE IN PROGRESS FOR 5 DAYS    Report Status PENDING  Incomplete   Organism ID, Bacteria STAPHYLOCOCCUS AUREUS  Final      Susceptibility   Staphylococcus aureus - MIC*    CIPROFLOXACIN <=0.5 SENSITIVE Sensitive     ERYTHROMYCIN 0.5 SENSITIVE Sensitive     GENTAMICIN <=0.5  SENSITIVE Sensitive     OXACILLIN <=0.25 SENSITIVE Sensitive     TETRACYCLINE <=1 SENSITIVE Sensitive     VANCOMYCIN <=0.5 SENSITIVE Sensitive     TRIMETH/SULFA <=10 SENSITIVE Sensitive     CLINDAMYCIN <=  0.25 SENSITIVE Sensitive     RIFAMPIN <=0.5 SENSITIVE Sensitive     Inducible Clindamycin NEGATIVE Sensitive     * ABUNDANT STAPHYLOCOCCUS AUREUS  Aerobic/Anaerobic Culture (surgical/deep wound)     Status: None (Preliminary result)   Collection Time: 12/03/16  7:16 PM  Result Value Ref Range Status   Specimen Description WOUND  Final   Special Requests LEFT PARASPINAL SWAB PT ON VANC ANCEF SAMPLE B  Final   Gram Stain   Final    MODERATE WBC PRESENT,BOTH PMN AND MONONUCLEAR MODERATE GRAM POSITIVE COCCI IN CLUSTERS    Culture   Final    ABUNDANT STAPHYLOCOCCUS AUREUS SUSCEPTIBILITIES PERFORMED ON PREVIOUS CULTURE WITHIN THE LAST 5 DAYS. NO ANAEROBES ISOLATED; CULTURE IN PROGRESS FOR 5 DAYS    Report Status PENDING  Incomplete  Aerobic/Anaerobic Culture (surgical/deep wound)     Status: None (Preliminary result)   Collection Time: 12/03/16  7:21 PM  Result Value Ref Range Status   Specimen Description WOUND  Final   Special Requests LEFT LUMBAR FACET 4,5 PT ON VANC ANCEF SAMPLE C  Final   Gram Stain   Final    FEW WBC PRESENT, PREDOMINANTLY PMN RARE GRAM POSITIVE COCCI IN PAIRS    Culture   Final    FEW STAPHYLOCOCCUS AUREUS SUSCEPTIBILITIES PERFORMED ON PREVIOUS CULTURE WITHIN THE LAST 5 DAYS. NO ANAEROBES ISOLATED; CULTURE IN PROGRESS FOR 5 DAYS    Report Status PENDING  Incomplete  Aerobic/Anaerobic Culture (surgical/deep wound)     Status: None (Preliminary result)   Collection Time: 12/03/16  7:28 PM  Result Value Ref Range Status   Specimen Description WOUND  Final   Special Requests   Final    LEFT LUMBAR FOUR EPIDURAL SPACE PT ON VANC ANCEF SAMPLE D   Gram Stain   Final    RARE WBC PRESENT,BOTH PMN AND MONONUCLEAR RARE GRAM POSITIVE COCCI IN CLUSTERS    Culture    Final    ABUNDANT STAPHYLOCOCCUS AUREUS SUSCEPTIBILITIES PERFORMED ON PREVIOUS CULTURE WITHIN THE LAST 5 DAYS. NO ANAEROBES ISOLATED; CULTURE IN PROGRESS FOR 5 DAYS    Report Status PENDING  Incomplete  Culture, blood (Routine X 2) w Reflex to ID Panel     Status: None (Preliminary result)   Collection Time: 12/04/16  2:10 PM  Result Value Ref Range Status   Specimen Description BLOOD LEFT ARM  Final   Special Requests BOTTLES DRAWN AEROBIC AND ANAEROBIC 5CC  Final   Culture NO GROWTH 3 DAYS  Final   Report Status PENDING  Incomplete  Culture, blood (Routine X 2) w Reflex to ID Panel     Status: None (Preliminary result)   Collection Time: 12/04/16  2:20 PM  Result Value Ref Range Status   Specimen Description BLOOD LEFT HAND  Final   Special Requests BOTTLES DRAWN AEROBIC AND ANAEROBIC 5CC  Final   Culture NO GROWTH 3 DAYS  Final   Report Status PENDING  Incomplete  Culture, blood (Routine X 2) w Reflex to ID Panel     Status: None (Preliminary result)   Collection Time: 12/06/16  3:25 PM  Result Value Ref Range Status   Specimen Description BLOOD LEFT ANTECUBITAL  Final   Special Requests BOTTLES DRAWN AEROBIC ONLY  Final   Culture NO GROWTH < 24 HOURS  Final   Report Status PENDING  Incomplete  Culture, blood (Routine X 2) w Reflex to ID Panel     Status: None (Preliminary result)   Collection Time:  12/06/16  3:29 PM  Result Value Ref Range Status   Specimen Description BLOOD LEFT ANTECUBITAL  Final   Special Requests BOTTLES DRAWN AEROBIC ONLY  5ML  Final   Culture NO GROWTH < 24 HOURS  Final   Report Status PENDING  Incomplete         Radiology Studies: No results found.      Scheduled Meds: . calcium carbonate  1 tablet Oral TID WC  .  ceFAZolin (ANCEF) IV  2 g Intravenous Q8H  . LORazepam  2 mg Oral UD  . nicotine  21 mg Transdermal Daily  . sodium chloride flush  3 mL Intravenous Q12H   Continuous Infusions: . sodium chloride 250 mL (12/07/16 0431)      LOS: 4 days    Time spent: 25 minutes.     Kathlen ModyAKULA,Laquan Ludden, MD [facility] Pager [phone]  If 7PM-7AM, please contact night-coverage [email or web address] Password [email or web address] 12/07/2016, 6:49 PM

## 2016-12-07 NOTE — Progress Notes (Signed)
OT Cancellation Note  Patient Details Name: Larry Best MRN: [id number] DOB: 05/17/1986   Cancelled Treatment:    Reason Eval/Treat Not Completed: Patient declined, no reason specified. Will follow.  [name], [name] 12/07/2016, 3:13 PM  [phone]

## 2016-12-08 DIAGNOSIS — K5900 Constipation, unspecified: Secondary | ICD-10-CM

## 2016-12-08 DIAGNOSIS — R3911 Hesitancy of micturition: Secondary | ICD-10-CM

## 2016-12-08 LAB — AEROBIC/ANAEROBIC CULTURE W GRAM STAIN (SURGICAL/DEEP WOUND)

## 2016-12-08 LAB — HCV RNA QUANT RFLX ULTRA OR GENOTYP
HCV RNA QNT(LOG COPY/ML): UNDETERMINED {Log_IU}/mL
HEPATITIS C QUANTITATION: NOT DETECTED [IU]/mL

## 2016-12-08 LAB — AEROBIC/ANAEROBIC CULTURE (SURGICAL/DEEP WOUND)

## 2016-12-08 MED ORDER — BUTALBITAL-APAP-CAFFEINE 50-325-40 MG PO TABS
2.0000 | ORAL_TABLET | Freq: Once | ORAL | Status: AC
  Administered 2016-12-09: 2 via ORAL
  Filled 2016-12-08: qty 2

## 2016-12-08 MED ORDER — GLYCERIN (LAXATIVE) 2.1 G RE SUPP
1.0000 | Freq: Every day | RECTAL | Status: DC | PRN
  Administered 2016-12-08: 1 via RECTAL
  Filled 2016-12-08 (×3): qty 1

## 2016-12-08 MED ORDER — MAGNESIUM CITRATE PO SOLN
0.5000 | Freq: Once | ORAL | Status: AC
  Administered 2016-12-08: 0.5 via ORAL
  Filled 2016-12-08: qty 296

## 2016-12-08 MED ORDER — SENNOSIDES-DOCUSATE SODIUM 8.6-50 MG PO TABS
2.0000 | ORAL_TABLET | Freq: Every day | ORAL | Status: DC
  Administered 2016-12-08 – 2016-12-09 (×2): 2 via ORAL
  Filled 2016-12-08 (×2): qty 2

## 2016-12-08 NOTE — Progress Notes (Signed)
Patient states h/a is worse than back. Patient encouraged to take oral medication and do incentive spirometry. Pain scale and treatment plan reviewed with patient. Patient and family v/u. RN will continue to monitor.

## 2016-12-08 NOTE — Progress Notes (Signed)
[facility] has been requested to perform a transesophageal echocardiogram on 12/09/16 for bacteremia.  After careful review of history and examination, the risks and benefits of transesophageal echocardiogram have been explained including risks of esophageal damage, perforation (1:10,000 risk), bleeding, pharyngeal hematoma as well as other potential complications associated with conscious sedation including aspiration, arrhythmia, respiratory failure and death. Alternatives to treatment were discussed, questions were answered. Patient is willing to proceed.  TEE - Dr. Royann Shiversroitoru @ 13:00. NPO after midnight. Meds with sips ok.   Little IshikawaErin E Brittain Smithey, NP 12/08/2016 1:44 PM

## 2016-12-08 NOTE — Progress Notes (Addendum)
INFECTIOUS DISEASE PROGRESS NOTE  ID: Larry Best is a 30 y.o. male with  Principal Problem:   Spinal epidural abscess Active Problems:   Abscess in epidural space of lumbar spine   IVDU (intravenous drug user)   Staphylococcus aureus bacteremia   MSSA (methicillin susceptible Staphylococcus aureus) infection   Headache   Pyomyositis  Subjective: Resting queitly Per wife constipation, urinary hesitancy, continued pain  Abtx:  Anti-infectives    Start     Dose/Rate Route Frequency Ordered Stop   12/05/16 0500  ceFAZolin (ANCEF) IVPB 2g/100 mL premix     2 g 200 mL/hr over 30 Minutes Intravenous Every 8 hours 12/04/16 2204     12/04/16 1730  ceFAZolin (ANCEF) IVPB 2g/100 mL premix  Status:  Discontinued     2 g 200 mL/hr over 30 Minutes Intravenous Every 8 hours 12/04/16 1432 12/04/16 2204   12/04/16 0300  vancomycin (VANCOCIN) 1,250 mg in sodium chloride 0.9 % 250 mL IVPB  Status:  Discontinued     1,250 mg 166.7 mL/hr over 90 Minutes Intravenous Every 8 hours 12/03/16 2330 12/04/16 0934   12/03/16 2359  ceFAZolin (ANCEF) IVPB 2g/100 mL premix     2 g 200 mL/hr over 30 Minutes Intravenous Every 8 hours 12/03/16 2245 12/04/16 0958   12/03/16 1946  vancomycin (VANCOCIN) powder  Status:  Discontinued       As needed 12/03/16 1947 12/03/16 2043   12/03/16 1855  50,000 units bacitracin in 0.9% normal saline 250 mL irrigation  Status:  Discontinued       As needed 12/03/16 1855 12/03/16 2043      Medications:  Scheduled: . calcium carbonate  1 tablet Oral TID WC  .  ceFAZolin (ANCEF) IV  2 g Intravenous Q8H  . LORazepam  2 mg Oral UD  . nicotine  21 mg Transdermal Daily  . senna-docusate  2 tablet Oral QHS  . sodium chloride flush  3 mL Intravenous Q12H    Objective: Vital signs in last 24 hours: Temp:  [98.1 F (36.7 C)-99.4 F (37.4 C)] 99.4 F (37.4 C) (12/27 0946) Pulse Rate:  [99-106] 104 (12/27 0946) Resp:  [18-20] 18 (12/27 0946) BP:  (118-138)/(55-76) 118/55 (12/27 0946) SpO2:  [93 %-98 %] 94 % (12/27 0946) Weight:  [108.2 kg (238 lb 8.6 oz)] 108.2 kg (238 lb 8.6 oz) (12/27 0506)   General appearance: fatigued and no distress  Lab Results  Recent Labs  12/07/16 0743  WBC 19.6*  HGB 10.6*  HCT 31.7*  NA 137  K 4.4  CL 99*  CO2 28  BUN 6  CREATININE 0.83   Liver Panel No results for input(s): PROT, ALBUMIN, AST, ALT, ALKPHOS, BILITOT, BILIDIR, IBILI in the last 72 hours. Sedimentation Rate No results for input(s): ESRSEDRATE in the last 72 hours. C-Reactive Protein No results for input(s): CRP in the last 72 hours.  Microbiology: Recent Results (from the past 240 hour(s))  Blood culture (routine x 2)     Status: Abnormal   Collection Time: 12/03/16  3:48 PM  Result Value Ref Range Status   Specimen Description BLOOD RIGHT ANTECUBITAL  Final   Special Requests BOTTLES DRAWN AEROBIC AND ANAEROBIC 5CC  Final   Culture  Setup Time   Final    GRAM POSITIVE COCCI IN CLUSTERS IN BOTH AEROBIC AND ANAEROBIC BOTTLES CRITICAL VALUE NOTED.  VALUE IS CONSISTENT WITH PREVIOUSLY REPORTED AND CALLED VALUE.    Culture (A)  Final    STAPHYLOCOCCUS  AUREUS SUSCEPTIBILITIES PERFORMED ON PREVIOUS CULTURE WITHIN THE LAST 5 DAYS.    Report Status 12/06/2016 FINAL  Final  Blood culture (routine x 2)     Status: Abnormal   Collection Time: 12/03/16  3:56 PM  Result Value Ref Range Status   Specimen Description BLOOD RIGHT HAND  Final   Special Requests BOTTLES DRAWN AEROBIC AND ANAEROBIC 5CC  Final   Culture  Setup Time   Final    GRAM POSITIVE COCCI IN CLUSTERS IN BOTH AEROBIC AND ANAEROBIC BOTTLES CRITICAL RESULT CALLED TO, READ BACK BY AND VERIFIED WITH: TONYA JOHNSON,PHARMD @0724  12/04/16 MKELLY,MLT    Culture STAPHYLOCOCCUS AUREUS (A)  Final   Report Status 12/06/2016 FINAL  Final   Organism ID, Bacteria STAPHYLOCOCCUS AUREUS  Final      Susceptibility   Staphylococcus aureus - MIC*    CIPROFLOXACIN <=0.5  SENSITIVE Sensitive     ERYTHROMYCIN <=0.25 SENSITIVE Sensitive     GENTAMICIN <=0.5 SENSITIVE Sensitive     OXACILLIN <=0.25 SENSITIVE Sensitive     TETRACYCLINE <=1 SENSITIVE Sensitive     VANCOMYCIN <=0.5 SENSITIVE Sensitive     TRIMETH/SULFA <=10 SENSITIVE Sensitive     CLINDAMYCIN <=0.25 SENSITIVE Sensitive     RIFAMPIN <=0.5 SENSITIVE Sensitive     Inducible Clindamycin NEGATIVE Sensitive     * STAPHYLOCOCCUS AUREUS  Blood Culture ID Panel (Reflexed)     Status: Abnormal   Collection Time: 12/03/16  3:56 PM  Result Value Ref Range Status   Enterococcus species NOT DETECTED NOT DETECTED Final   Listeria monocytogenes NOT DETECTED NOT DETECTED Final   Staphylococcus species DETECTED (A) NOT DETECTED Final    Comment: CRITICAL RESULT CALLED TO, READ BACK BY AND VERIFIED WITH: Renard Hamper, PHARMD @0724  12/04/16 MKELLY,MLT    Staphylococcus aureus DETECTED (A) NOT DETECTED Final    Comment: CRITICAL RESULT CALLED TO, READ BACK BY AND VERIFIED WITH: Renard Hamper, PHARMD @0724  12/04/16 MKELLY,MLT    Methicillin resistance NOT DETECTED NOT DETECTED Final   Streptococcus species NOT DETECTED NOT DETECTED Final   Streptococcus agalactiae NOT DETECTED NOT DETECTED Final   Streptococcus pneumoniae NOT DETECTED NOT DETECTED Final   Streptococcus pyogenes NOT DETECTED NOT DETECTED Final   Acinetobacter baumannii NOT DETECTED NOT DETECTED Final   Enterobacteriaceae species NOT DETECTED NOT DETECTED Final   Enterobacter cloacae complex NOT DETECTED NOT DETECTED Final   Escherichia coli NOT DETECTED NOT DETECTED Final   Klebsiella oxytoca NOT DETECTED NOT DETECTED Final   Klebsiella pneumoniae NOT DETECTED NOT DETECTED Final   Proteus species NOT DETECTED NOT DETECTED Final   Serratia marcescens NOT DETECTED NOT DETECTED Final   Haemophilus influenzae NOT DETECTED NOT DETECTED Final   Neisseria meningitidis NOT DETECTED NOT DETECTED Final   Pseudomonas aeruginosa NOT DETECTED NOT  DETECTED Final   Candida albicans NOT DETECTED NOT DETECTED Final   Candida glabrata NOT DETECTED NOT DETECTED Final   Candida krusei NOT DETECTED NOT DETECTED Final   Candida parapsilosis NOT DETECTED NOT DETECTED Final   Candida tropicalis NOT DETECTED NOT DETECTED Final  Aerobic/Anaerobic Culture (surgical/deep wound)     Status: None   Collection Time: 12/03/16  7:14 PM  Result Value Ref Range Status   Specimen Description WOUND  Final   Special Requests SWAB OF LEFT PARASPINAL PT ON ANCEF VANC SAMPLE A  Final   Gram Stain   Final    RARE WBC PRESENT,BOTH PMN AND MONONUCLEAR FEW GRAM POSITIVE COCCI IN CLUSTERS    Culture  Final    ABUNDANT STAPHYLOCOCCUS AUREUS NO ANAEROBES ISOLATED    Report Status 12/08/2016 FINAL  Final   Organism ID, Bacteria STAPHYLOCOCCUS AUREUS  Final      Susceptibility   Staphylococcus aureus - MIC*    CIPROFLOXACIN <=0.5 SENSITIVE Sensitive     ERYTHROMYCIN 0.5 SENSITIVE Sensitive     GENTAMICIN <=0.5 SENSITIVE Sensitive     OXACILLIN <=0.25 SENSITIVE Sensitive     TETRACYCLINE <=1 SENSITIVE Sensitive     VANCOMYCIN <=0.5 SENSITIVE Sensitive     TRIMETH/SULFA <=10 SENSITIVE Sensitive     CLINDAMYCIN <=0.25 SENSITIVE Sensitive     RIFAMPIN <=0.5 SENSITIVE Sensitive     Inducible Clindamycin NEGATIVE Sensitive     * ABUNDANT STAPHYLOCOCCUS AUREUS  Aerobic/Anaerobic Culture (surgical/deep wound)     Status: None   Collection Time: 12/03/16  7:16 PM  Result Value Ref Range Status   Specimen Description WOUND  Final   Special Requests LEFT PARASPINAL SWAB PT ON VANC ANCEF SAMPLE B  Final   Gram Stain   Final    MODERATE WBC PRESENT,BOTH PMN AND MONONUCLEAR MODERATE GRAM POSITIVE COCCI IN CLUSTERS    Culture   Final    ABUNDANT STAPHYLOCOCCUS AUREUS SUSCEPTIBILITIES PERFORMED ON PREVIOUS CULTURE WITHIN THE LAST 5 DAYS. NO ANAEROBES ISOLATED    Report Status 12/08/2016 FINAL  Final  Aerobic/Anaerobic Culture (surgical/deep wound)     Status:  None   Collection Time: 12/03/16  7:21 PM  Result Value Ref Range Status   Specimen Description WOUND  Final   Special Requests LEFT LUMBAR FACET 4,5 PT ON VANC ANCEF SAMPLE C  Final   Gram Stain   Final    FEW WBC PRESENT, PREDOMINANTLY PMN RARE GRAM POSITIVE COCCI IN PAIRS    Culture   Final    FEW STAPHYLOCOCCUS AUREUS SUSCEPTIBILITIES PERFORMED ON PREVIOUS CULTURE WITHIN THE LAST 5 DAYS. NO ANAEROBES ISOLATED    Report Status 12/08/2016 FINAL  Final  Aerobic/Anaerobic Culture (surgical/deep wound)     Status: None   Collection Time: 12/03/16  7:28 PM  Result Value Ref Range Status   Specimen Description WOUND  Final   Special Requests   Final    LEFT LUMBAR FOUR EPIDURAL SPACE PT ON VANC ANCEF SAMPLE D   Gram Stain   Final    RARE WBC PRESENT,BOTH PMN AND MONONUCLEAR RARE GRAM POSITIVE COCCI IN CLUSTERS    Culture   Final    ABUNDANT STAPHYLOCOCCUS AUREUS SUSCEPTIBILITIES PERFORMED ON PREVIOUS CULTURE WITHIN THE LAST 5 DAYS. NO ANAEROBES ISOLATED    Report Status 12/08/2016 FINAL  Final  Culture, blood (Routine X 2) w Reflex to ID Panel     Status: None (Preliminary result)   Collection Time: 12/04/16  2:10 PM  Result Value Ref Range Status   Specimen Description BLOOD LEFT ARM  Final   Special Requests BOTTLES DRAWN AEROBIC AND ANAEROBIC 5CC  Final   Culture NO GROWTH 3 DAYS  Final   Report Status PENDING  Incomplete  Culture, blood (Routine X 2) w Reflex to ID Panel     Status: None (Preliminary result)   Collection Time: 12/04/16  2:20 PM  Result Value Ref Range Status   Specimen Description BLOOD LEFT HAND  Final   Special Requests BOTTLES DRAWN AEROBIC AND ANAEROBIC 5CC  Final   Culture NO GROWTH 3 DAYS  Final   Report Status PENDING  Incomplete  Culture, blood (Routine X 2) w Reflex to ID Panel  Status: None (Preliminary result)   Collection Time: 12/06/16  3:25 PM  Result Value Ref Range Status   Specimen Description BLOOD LEFT ANTECUBITAL  Final    Special Requests BOTTLES DRAWN AEROBIC ONLY 5ML  Final   Culture NO GROWTH < 24 HOURS  Final   Report Status PENDING  Incomplete  Culture, blood (Routine X 2) w Reflex to ID Panel     Status: None (Preliminary result)   Collection Time: 12/06/16  3:29 PM  Result Value Ref Range Status   Specimen Description BLOOD LEFT ANTECUBITAL  Final   Special Requests BOTTLES DRAWN AEROBIC ONLY  5ML  Final   Culture NO GROWTH < 24 HOURS  Final   Report Status PENDING  Incomplete    Studies/Results: No results found.   Assessment/Plan: Epidural Abscess MSSA bacteremia Fever Hepatitis C immune (Ab+, RNA-)  TEE tomorrow Repeat BCx are ngtd.  Some concern about placing PIC, may have to send him out on prolonged po... Asking for pain mgmt Primary to address constipation, urinary hesitancy. Suspect these are related to pain, pain meds, spinal inflamation  Discussed with wife in detail.   Total days of antibiotics: 5 ancef        Johny SaxJeffrey [facility] (pager) [phone] [email or web address] 12/08/2016, 2:16 PM  LOS: 5 days

## 2016-12-08 NOTE — Progress Notes (Addendum)
Pt is also constipated abdomen distended and tender, bowel sounds are faint and hypoactive in all 4 quadrants, Amion attending for order of Magnesium Citrate awaiting reply.  Attempted to educate pt on the negative effects of opioid medication on the intestines he was not very receptive.

## 2016-12-08 NOTE — Progress Notes (Signed)
Pt having intermitant difficulty urinating, he produces around 100 ml frequently but does not empty his bladder enough to feel relived.  Bladder scanned him found 752 ml and performed an in and out and obtained 952 with some blood clots. This issue has come and gone for him for the last two nights. Perhaps a urological consult may be needed.

## 2016-12-08 NOTE — Progress Notes (Signed)
OT Cancellation Note  Patient Details Name: Larry Best MRN: [id number] DOB: 11/27/1986   Cancelled Treatment:    Reason Eval/Treat Not Completed: Patient declined, no reason specified  [name], Daziah Hesler [place] 12/08/2016, 3:07 PM

## 2016-12-08 NOTE — Progress Notes (Signed)
PROGRESS NOTE  Larry Best WUJ:[id number] DOB: 1986/12/05 DOA: 12/03/2016 PCP: [facility]   LOS: 5 days   Brief Narrative: Larry Larsson [place]is a 30 y.o.malewith Hx of IVDU , reports a mechanical fall a week ago, comes back with worsening back pain, associated with fevers and chills  Found to have with MSSA bacteremia and epidural abscess with compression, s/p laminectomies. MRI shows lumbar diskitis, possible vertebral osteomyelitis. Echocardiogram shows LVEF 60-65%, mild LVH, normal wall motion, normal diastolicfunction, mild MR, mild LAE, normal IVC.  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 Ninetta Lights today, obtain a TEE. - Neurosurgery consulted, patient is status post bilateral and complete laminectomies of L4, L5, and S1 with medial facetectomies at L4-5 L5-S1 and foraminotomies of the L4, L5, S1, and S2 nerve roots on 12/22 - Repeat blood cultures are negative to date - Neurosurgery recommending outpatient follow up with Dr Wynetta Emery in the office in 10 days.   Mild normocytic anemia - Stable  Headache - Resolved, patient could not tolerate MRI, discussed with Dr. Ninetta Lights, we'll cancel MRI  H/o IVDU: - Hepatitis panel shows hepatitis C antibodies. HCV RNA ordered by ID.  - HIV antibody is negative.   Leukocytosis - still elevated  Persistent fevers' possibly from the epidural abscess.   DVT prophylaxis: SCD Code Status: Full Family Communication: friend bedside Disposition Plan: TBD  Consultants:   ID  Neurosurgery   Procedures:  - Bilateral  and Complete laminectomies of L4, L5, and S1 with medial facetectomies at L4-5 L5-S1 and foraminotomies of the L4, L5, S1, and S2 nerve roots on 12/22  TEE pending  2D echo Impressions: - LVEF 60-65%, mild LVH, normal wall motion, normal diastolic function, mild MR, mild LAE, normal IVC. No obvious valvular vegetations, however, if pretest probability of endocarditis is high, consider TEE.  Antimicrobials:  Ancef 12/22 >>   Subjective: - complains of back pain. Denies headache  Objective: Vitals:   12/07/16 2134 12/08/16 0048 12/08/16 0506 12/08/16 0946  BP: 130/64 138/69 121/62 (!) 118/55  Pulse: (!) 101 99 (!) 106 (!) 104  Resp: 20 20 20 18   Temp: 98.5 F (36.9 C) 98.1 F (36.7 C) 99.4 F (37.4 C) 99.4 F (37.4 C)  TempSrc: Oral Oral Oral Oral  SpO2: 98% 96% 95% 94%  Weight:   108.2 kg (238 lb 8.6 oz)   Height:        Intake/Output Summary (Last 24 hours) at 12/08/16 1305 Last data filed at 12/08/16 0843  Gross per 24 hour  Intake                3 ml  Output             1873 ml  Net            -1870 ml   Filed Weights   12/03/16 1445 12/07/16 0503 12/08/16 0506  Weight: 100.7 kg (222 lb) 108 kg (238 lb) 108.2 kg (238 lb 8.6 oz)    Examination: Constitutional: NAD Vitals:   12/07/16 2134 12/08/16 0048 12/08/16 0506 12/08/16 0946  BP: 130/64 138/69 121/62 (!) 118/55  Pulse: (!) 101 99 (!)  106 (!) 104  Resp: 20 20 20 18   Temp: 98.5 F (36.9 C) 98.1 F (36.7 C) 99.4 F (37.4 C) 99.4 F (37.4 C)  TempSrc: Oral Oral Oral Oral  SpO2: 98% 96% 95% 94%  Weight:   108.2 kg (238 lb 8.6 oz)   Height:       Eyes: PERRL, lids and conjunctivae normal Respiratory: clear to auscultation bilaterally, no wheezing, no crackles. N Cardiovascular: Regular rate and rhythm, no murmurs / rubs / gallops.  Abdomen: no tenderness. Bowel sounds positive.  Skin: no rashes, lesions, ulcers. No induration Neurologic: non focal    Data Reviewed: I have personally reviewed  following labs and imaging studies  CBC:  Recent Labs Lab 12/03/16 1553 12/04/16 0459 12/07/16 0743  WBC 18.5* 17.9* 19.6*  NEUTROABS 13.8* 12.7*  --   HGB 11.8* 11.3* 10.6*  HCT 35.6* 33.9* 31.7*  MCV 88.6 88.7 88.3  PLT 229 228 330   Basic Metabolic Panel:  Recent Labs Lab 12/03/16 1553 12/04/16 0459 12/07/16 0743  NA 136 137 137  K 3.5 3.7 4.4  CL 102 100* 99*  CO2 25 26 28   GLUCOSE 91 100* 117*  BUN 9 5* 6  CREATININE 0.76 0.86 0.83  CALCIUM 8.2* 8.1* 8.5*   GFR: Estimated Creatinine Clearance: 159.2 mL/min (by C-G formula based on SCr of 0.83 mg/dL). Liver Function Tests:  Recent Labs Lab 12/04/16 0459  AST 23  ALT 33  ALKPHOS 143*  BILITOT 0.5  PROT 5.7*  ALBUMIN 2.2*   No results for input(s): LIPASE, AMYLASE in the last 168 hours. No results for input(s): AMMONIA in the last 168 hours. Coagulation Profile:  Recent Labs Lab 12/03/16 1553  INR 0.98   Cardiac Enzymes: No results for input(s): CKTOTAL, CKMB, CKMBINDEX, TROPONINI in the last 168 hours. BNP (last 3 results) No results for input(s): PROBNP in the last 8760 hours. HbA1C: No results for input(s): HGBA1C in the last 72 hours. CBG: No results for input(s): GLUCAP in the last 168 hours. Lipid Profile: No results for input(s): CHOL, HDL, LDLCALC, TRIG, CHOLHDL, LDLDIRECT in the last 72 hours. Thyroid Function Tests: No results for input(s): TSH, T4TOTAL, FREET4, T3FREE, THYROIDAB in the last 72 hours. Anemia Panel: No results for input(s): VITAMINB12, FOLATE, FERRITIN, TIBC, IRON, RETICCTPCT in the last 72 hours. Urine analysis:    Component Value Date/Time   COLORURINE YELLOW 03/21/2014 1109   APPEARANCEUR CLEAR 03/21/2014 1109   LABSPEC 1.022 03/21/2014 1109   PHURINE 7.0 03/21/2014 1109   GLUCOSEU NEGATIVE 03/21/2014 1109   HGBUR NEGATIVE 03/21/2014 1109   BILIRUBINUR NEGATIVE 03/21/2014 1109   KETONESUR NEGATIVE 03/21/2014 1109   PROTEINUR NEGATIVE 03/21/2014 1109    UROBILINOGEN 0.2 03/21/2014 1109   NITRITE NEGATIVE 03/21/2014 1109   LEUKOCYTESUR NEGATIVE 03/21/2014 1109   Sepsis Labs: Invalid input(s): PROCALCITONIN, LACTICIDVEN  Recent Results (from the past 240 hour(s))  Blood culture (routine x 2)     Status: Abnormal   Collection Time: 12/03/16  3:48 PM  Result Value Ref Range Status   Specimen Description BLOOD RIGHT ANTECUBITAL  Final   Special Requests BOTTLES DRAWN AEROBIC AND ANAEROBIC 5CC  Final   Culture  Setup Time   Final    GRAM POSITIVE COCCI IN CLUSTERS IN BOTH AEROBIC AND ANAEROBIC BOTTLES CRITICAL VALUE NOTED.  VALUE IS CONSISTENT WITH PREVIOUSLY REPORTED AND CALLED VALUE.    Culture (A)  Final    STAPHYLOCOCCUS AUREUS SUSCEPTIBILITIES PERFORMED ON PREVIOUS CULTURE WITHIN THE LAST 5  DAYS.    Report Status 12/06/2016 FINAL  Final  Blood culture (routine x 2)     Status: Abnormal   Collection Time: 12/03/16  3:56 PM  Result Value Ref Range Status   Specimen Description BLOOD RIGHT HAND  Final   Special Requests BOTTLES DRAWN AEROBIC AND ANAEROBIC 5CC  Final   Culture  Setup Time   Final    GRAM POSITIVE COCCI IN CLUSTERS IN BOTH AEROBIC AND ANAEROBIC BOTTLES CRITICAL RESULT CALLED TO, READ BACK BY AND VERIFIED WITH: TONYA JOHNSON,PHARMD @0724  12/04/16 MKELLY,MLT    Culture STAPHYLOCOCCUS AUREUS (A)  Final   Report Status 12/06/2016 FINAL  Final   Organism ID, Bacteria STAPHYLOCOCCUS AUREUS  Final      Susceptibility   Staphylococcus aureus - MIC*    CIPROFLOXACIN <=0.5 SENSITIVE Sensitive     ERYTHROMYCIN <=0.25 SENSITIVE Sensitive     GENTAMICIN <=0.5 SENSITIVE Sensitive     OXACILLIN <=0.25 SENSITIVE Sensitive     TETRACYCLINE <=1 SENSITIVE Sensitive     VANCOMYCIN <=0.5 SENSITIVE Sensitive     TRIMETH/SULFA <=10 SENSITIVE Sensitive     CLINDAMYCIN <=0.25 SENSITIVE Sensitive     RIFAMPIN <=0.5 SENSITIVE Sensitive     Inducible Clindamycin NEGATIVE Sensitive     * STAPHYLOCOCCUS AUREUS  Blood Culture ID Panel  (Reflexed)     Status: Abnormal   Collection Time: 12/03/16  3:56 PM  Result Value Ref Range Status   Enterococcus species NOT DETECTED NOT DETECTED Final   Listeria monocytogenes NOT DETECTED NOT DETECTED Final   Staphylococcus species DETECTED (A) NOT DETECTED Final    Comment: CRITICAL RESULT CALLED TO, READ BACK BY AND VERIFIED WITH: Renard HamperONYA JOHNSON, PHARMD @0724  12/04/16 MKELLY,MLT    Staphylococcus aureus DETECTED (A) NOT DETECTED Final    Comment: CRITICAL RESULT CALLED TO, READ BACK BY AND VERIFIED WITH: Renard HamperONYA JOHNSON, PHARMD @0724  12/04/16 MKELLY,MLT    Methicillin resistance NOT DETECTED NOT DETECTED Final   Streptococcus species NOT DETECTED NOT DETECTED Final   Streptococcus agalactiae NOT DETECTED NOT DETECTED Final   Streptococcus pneumoniae NOT DETECTED NOT DETECTED Final   Streptococcus pyogenes NOT DETECTED NOT DETECTED Final   Acinetobacter baumannii NOT DETECTED NOT DETECTED Final   Enterobacteriaceae species NOT DETECTED NOT DETECTED Final   Enterobacter cloacae complex NOT DETECTED NOT DETECTED Final   Escherichia coli NOT DETECTED NOT DETECTED Final   Klebsiella oxytoca NOT DETECTED NOT DETECTED Final   Klebsiella pneumoniae NOT DETECTED NOT DETECTED Final   Proteus species NOT DETECTED NOT DETECTED Final   Serratia marcescens NOT DETECTED NOT DETECTED Final   Haemophilus influenzae NOT DETECTED NOT DETECTED Final   Neisseria meningitidis NOT DETECTED NOT DETECTED Final   Pseudomonas aeruginosa NOT DETECTED NOT DETECTED Final   Candida albicans NOT DETECTED NOT DETECTED Final   Candida glabrata NOT DETECTED NOT DETECTED Final   Candida krusei NOT DETECTED NOT DETECTED Final   Candida parapsilosis NOT DETECTED NOT DETECTED Final   Candida tropicalis NOT DETECTED NOT DETECTED Final  Aerobic/Anaerobic Culture (surgical/deep wound)     Status: None   Collection Time: 12/03/16  7:14 PM  Result Value Ref Range Status   Specimen Description WOUND  Final   Special  Requests SWAB OF LEFT PARASPINAL PT ON ANCEF VANC SAMPLE A  Final   Gram Stain   Final    RARE WBC PRESENT,BOTH PMN AND MONONUCLEAR FEW GRAM POSITIVE COCCI IN CLUSTERS    Culture   Final    ABUNDANT STAPHYLOCOCCUS AUREUS NO ANAEROBES  ISOLATED    Report Status 12/08/2016 FINAL  Final   Organism ID, Bacteria STAPHYLOCOCCUS AUREUS  Final      Susceptibility   Staphylococcus aureus - MIC*    CIPROFLOXACIN <=0.5 SENSITIVE Sensitive     ERYTHROMYCIN 0.5 SENSITIVE Sensitive     GENTAMICIN <=0.5 SENSITIVE Sensitive     OXACILLIN <=0.25 SENSITIVE Sensitive     TETRACYCLINE <=1 SENSITIVE Sensitive     VANCOMYCIN <=0.5 SENSITIVE Sensitive     TRIMETH/SULFA <=10 SENSITIVE Sensitive     CLINDAMYCIN <=0.25 SENSITIVE Sensitive     RIFAMPIN <=0.5 SENSITIVE Sensitive     Inducible Clindamycin NEGATIVE Sensitive     * ABUNDANT STAPHYLOCOCCUS AUREUS  Aerobic/Anaerobic Culture (surgical/deep wound)     Status: None   Collection Time: 12/03/16  7:16 PM  Result Value Ref Range Status   Specimen Description WOUND  Final   Special Requests LEFT PARASPINAL SWAB PT ON VANC ANCEF SAMPLE B  Final   Gram Stain   Final    MODERATE WBC PRESENT,BOTH PMN AND MONONUCLEAR MODERATE GRAM POSITIVE COCCI IN CLUSTERS    Culture   Final    ABUNDANT STAPHYLOCOCCUS AUREUS SUSCEPTIBILITIES PERFORMED ON PREVIOUS CULTURE WITHIN THE LAST 5 DAYS. NO ANAEROBES ISOLATED    Report Status 12/08/2016 FINAL  Final  Aerobic/Anaerobic Culture (surgical/deep wound)     Status: None   Collection Time: 12/03/16  7:21 PM  Result Value Ref Range Status   Specimen Description WOUND  Final   Special Requests LEFT LUMBAR FACET 4,5 PT ON VANC ANCEF SAMPLE C  Final   Gram Stain   Final    FEW WBC PRESENT, PREDOMINANTLY PMN RARE GRAM POSITIVE COCCI IN PAIRS    Culture   Final    FEW STAPHYLOCOCCUS AUREUS SUSCEPTIBILITIES PERFORMED ON PREVIOUS CULTURE WITHIN THE LAST 5 DAYS. NO ANAEROBES ISOLATED    Report Status 12/08/2016 FINAL   Final  Aerobic/Anaerobic Culture (surgical/deep wound)     Status: None   Collection Time: 12/03/16  7:28 PM  Result Value Ref Range Status   Specimen Description WOUND  Final   Special Requests   Final    LEFT LUMBAR FOUR EPIDURAL SPACE PT ON VANC ANCEF SAMPLE D   Gram Stain   Final    RARE WBC PRESENT,BOTH PMN AND MONONUCLEAR RARE GRAM POSITIVE COCCI IN CLUSTERS    Culture   Final    ABUNDANT STAPHYLOCOCCUS AUREUS SUSCEPTIBILITIES PERFORMED ON PREVIOUS CULTURE WITHIN THE LAST 5 DAYS. NO ANAEROBES ISOLATED    Report Status 12/08/2016 FINAL  Final  Culture, blood (Routine X 2) w Reflex to ID Panel     Status: None (Preliminary result)   Collection Time: 12/04/16  2:10 PM  Result Value Ref Range Status   Specimen Description BLOOD LEFT ARM  Final   Special Requests BOTTLES DRAWN AEROBIC AND ANAEROBIC 5CC  Final   Culture NO GROWTH 3 DAYS  Final   Report Status PENDING  Incomplete  Culture, blood (Routine X 2) w Reflex to ID Panel     Status: None (Preliminary result)   Collection Time: 12/04/16  2:20 PM  Result Value Ref Range Status   Specimen Description BLOOD LEFT HAND  Final   Special Requests BOTTLES DRAWN AEROBIC AND ANAEROBIC 5CC  Final   Culture NO GROWTH 3 DAYS  Final   Report Status PENDING  Incomplete  Culture, blood (Routine X 2) w Reflex to ID Panel     Status: None (Preliminary result)   Collection Time:  12/06/16  3:25 PM  Result Value Ref Range Status   Specimen Description BLOOD LEFT ANTECUBITAL  Final   Special Requests BOTTLES DRAWN AEROBIC ONLY  Final   Culture NO GROWTH < 24 HOURS  Final   Report Status PENDING  Incomplete  Culture, blood (Routine X 2) w Reflex to ID Panel     Status: None (Preliminary result)   Collection Time: 12/06/16  3:29 PM  Result Value Ref Range Status   Specimen Description BLOOD LEFT ANTECUBITAL  Final   Special Requests BOTTLES DRAWN AEROBIC ONLY   Final   Culture NO GROWTH < 24 HOURS  Final   Report Status PENDING   Incomplete     Radiology Studies: No results found.  Scheduled Meds: . calcium carbonate  1 tablet Oral TID WC  .  ceFAZolin (ANCEF) IV  2 g Intravenous Q8H  . LORazepam  2 mg Oral UD  . nicotine  21 mg Transdermal Daily  . senna-docusate  2 tablet Oral QHS  . sodium chloride flush  3 mL Intravenous Q12H   Continuous Infusions: . sodium chloride 250 mL (12/07/16 0431)    Pamella Pert, MD, PhD [facility] Pager [phone]  If 7PM-7AM, please contact night-coverage [email or web address] Password [email or web address] 12/08/2016, 1:05 PM

## 2016-12-08 NOTE — Progress Notes (Signed)
PT Cancellation Note  Patient Details Name: Larry Best MRN: [id number] DOB: 06/05/1986   Cancelled Treatment:    Reason Eval/Treat Not Completed: Pain limiting ability to participate (Pt sleeping in sidelying refusing to get OOB.  Pt and spouse in room reports he has been ambulating in halls.  Pt refused despite education and encouragement.  )   Florestine Aversimee J Dozier Berkovich 12/08/2016, 1:17 PM  Joycelyn RuaAimee Lachina Salsberry, PTA pager [phone]

## 2016-12-08 NOTE — Progress Notes (Signed)
Pt refused MRI unless he is able to be put to sleep. Pt stated that he is claustrophobic. Spoke with MRI department unaware that pt requests to be put to sleep for procedure.  Pt states that Ativan does not work for him. MD notified.

## 2016-12-09 ENCOUNTER — Inpatient Hospital Stay (HOSPITAL_COMMUNITY): Payer: Self-pay | Admitting: Certified Registered Nurse Anesthetist

## 2016-12-09 ENCOUNTER — Inpatient Hospital Stay (HOSPITAL_COMMUNITY): Payer: Self-pay

## 2016-12-09 ENCOUNTER — Encounter (HOSPITAL_COMMUNITY): Payer: Self-pay | Admitting: Certified Registered Nurse Anesthetist

## 2016-12-09 ENCOUNTER — Encounter (HOSPITAL_COMMUNITY)
Admission: EM | Disposition: A | Discharge status: Home / Self Care | Payer: Self-pay | Source: Home / Self Care | Attending: Internal Medicine

## 2016-12-09 DIAGNOSIS — R7881 Bacteremia: Secondary | ICD-10-CM

## 2016-12-09 HISTORY — PX: TEE WITHOUT CARDIOVERSION: SHX5443

## 2016-12-09 LAB — CULTURE, BLOOD (ROUTINE X 2): Culture: NO GROWTH

## 2016-12-09 SURGERY — ECHOCARDIOGRAM, TRANSESOPHAGEAL
Anesthesia: Monitor Anesthesia Care

## 2016-12-09 MED ORDER — SODIUM CHLORIDE 0.9 % IV SOLN
INTRAVENOUS | Status: DC
  Administered 2016-12-09: 12:00:00 via INTRAVENOUS

## 2016-12-09 MED ORDER — PROPOFOL 10 MG/ML IV BOLUS
INTRAVENOUS | Status: DC | PRN
  Administered 2016-12-09 (×2): 20 mg via INTRAVENOUS
  Administered 2016-12-09: 40 mg via INTRAVENOUS

## 2016-12-09 MED ORDER — BUTALBITAL-APAP-CAFFEINE 50-325-40 MG PO TABS
1.0000 | ORAL_TABLET | Freq: Once | ORAL | Status: AC
  Administered 2016-12-09: 1 via ORAL
  Filled 2016-12-09: qty 2

## 2016-12-09 MED ORDER — MAGNESIUM HYDROXIDE 400 MG/5ML PO SUSP
960.0000 mL | Freq: Once | ORAL | Status: AC
  Administered 2016-12-09: 960 mL via RECTAL
  Filled 2016-12-09: qty 240

## 2016-12-09 MED ORDER — PROPOFOL 500 MG/50ML IV EMUL
INTRAVENOUS | Status: DC | PRN
Start: 2016-12-09 — End: 2016-12-09
  Administered 2016-12-09: 160 ug/kg/min via INTRAVENOUS

## 2016-12-09 MED ORDER — LIDOCAINE HCL (CARDIAC) 20 MG/ML IV SOLN
INTRAVENOUS | Status: DC | PRN
  Administered 2016-12-09: 100 mg via INTRAVENOUS

## 2016-12-09 NOTE — Care Management Note (Signed)
Case Management Note  Patient Details  Name: Larry Best MRN: [id number] Date of Birth: 05/11/1986  Subjective/Objective:                    Action/Plan: Pt without insurance and states he does not have a PCP. CM spoke to him and his wife about [facility] They were interested in attending. CM called [facility] and was unable to make an appointment. They asked that the patient call on Tuesday and schedule a hospital f/u appointment for the end of next week. [facility] stated the patient could use the pharmacy at discharge to assist with medication coverage but he would have to be a patient of the clinic to continue using the pharmacy. CM gave the patients wife the information on the clinic, when to call for an appointment and information on the pharmacy. CM following.   Expected Discharge Date:                  Expected Discharge Plan:  Home/Self Care  In-House Referral:     Discharge planning Services     Post Acute Care Choice:    Choice offered to:     DME Arranged:    DME Agency:     HH Arranged:    HH Agency:     Status of Service:  In process, will continue to follow  If discussed at Long Length of Stay Meetings, dates discussed:    Additional Comments:  [name], RN 12/09/2016, 3:04 PM

## 2016-12-09 NOTE — Progress Notes (Signed)
PROGRESS NOTE  Larry Best:[id number] DOB: 03/30/1986 DOA: 12/03/2016 PCP: [facility]   LOS: 6 days   Brief Narrative: Larry Larsson [place]is a 30 y.o.malewith Hx of IVDU , reports a mechanical fall a week ago, comes back with worsening back pain, associated with fevers and chills  Found to have with MSSA bacteremia and epidural abscess with compression, s/p laminectomies. MRI shows lumbar diskitis, possible vertebral osteomyelitis. Echocardiogram shows LVEF 60-65%, mild LVH, normal wall motion, normal diastolicfunction, mild MR, mild LAE, normal IVC.  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 Ninetta Lights, obtain a TEE which is scheduled for today afternoon - Neurosurgery consulted, patient is status post bilateral and complete laminectomies of L4, L5, and S1 with medial facetectomies at L4-5 L5-S1 and foraminotomies of the L4, L5, S1, and S2 nerve roots on 12/22 - Repeat blood cultures are negative to date - Neurosurgery recommending outpatient follow up with Dr Wynetta Emery in the office in 10 days.  - will discuss with infectious disease based on TEE results  Mild normocytic anemia - Stable  Headache - Resolved  H/o IVDU: - Hepatitis panel shows hepatitis C antibodies. HCV RNA ordered by ID.  - HIV antibody is negative.   Leukocytosis - still elevated  Persistent fevers' possibly from the epidural abscess.    DVT prophylaxis: SCD Code Status: Full Family Communication: girlfriend @ bedside Disposition Plan: TBD  Consultants:   ID  Neurosurgery    Procedures:  - Bilateral and Complete laminectomies of L4, L5, and S1 with medial facetectomies at L4-5 L5-S1 and foraminotomies of the L4, L5, S1, and S2 nerve roots on 12/22  TEE pending  2D echo Impressions: - LVEF 60-65%, mild LVH, normal wall motion, normal diastolic function, mild MR, mild LAE, normal IVC. No obvious valvular vegetations, however, if pretest probability of endocarditis is high, consider TEE.  Antimicrobials:  Ancef 12/22 >>   Subjective: - Appreciate his back pain is improving today, denies any constipation or urinary changes today  Objective: Vitals:   12/08/16 2109 12/09/16 0124 12/09/16 0458 12/09/16 1028  BP: 132/66 139/68 (!) 111/54 (!) 127/55  Pulse: (!) 121 (!) 112 92 94  Resp: 20 20 20 17   Temp: 100.1 F (37.8 C) 99.9 F (37.7 C) 98.4 F (36.9 C) 98.6 F (37 C)  TempSrc: Oral Oral Oral Oral  SpO2: 95% 96% 96% 99%  Weight:      Height:        Intake/Output Summary (Last 24 hours) at 12/09/16 1048 Last data filed at 12/09/16 0458  Gross per 24 hour  Intake                3 ml  Output             1550 ml  Net            -1547 ml   Filed Weights   12/03/16 1445 12/07/16 0503 12/08/16 0506  Weight: 100.7 kg (222 lb) 108 kg (238 lb) 108.2 kg (238 lb 8.6 oz)    Examination: Constitutional: NAD Vitals:   12/08/16 2109 12/09/16 0124 12/09/16 0458 12/09/16 1028  BP: 132/66 139/68 Marland Kitchen)  111/54 (!) 127/55  Pulse: (!) 121 (!) 112 92 94  Resp: 20 20 20 17   Temp: 100.1 F (37.8 C) 99.9 F (37.7 C) 98.4 F (36.9 C) 98.6 F (37 C)  TempSrc: Oral Oral Oral Oral  SpO2: 95% 96% 96% 99%  Weight:      Height:       Eyes: PERRL, lids and conjunctivae normal Respiratory: clear to auscultation bilaterally, no wheezing, no crackles. N Cardiovascular: Regular rate and rhythm, no murmurs / rubs / gallops.  Abdomen: no tenderness. Bowel sounds positive.  Skin: no rashes, lesions, ulcers. No induration    Data Reviewed: I have personally  reviewed following labs and imaging studies  CBC:  Recent Labs Lab 12/03/16 1553 12/04/16 0459 12/07/16 0743  WBC 18.5* 17.9* 19.6*  NEUTROABS 13.8* 12.7*  --   HGB 11.8* 11.3* 10.6*  HCT 35.6* 33.9* 31.7*  MCV 88.6 88.7 88.3  PLT 229 228 330   Basic Metabolic Panel:  Recent Labs Lab 12/03/16 1553 12/04/16 0459 12/07/16 0743  NA 136 137 137  K 3.5 3.7 4.4  CL 102 100* 99*  CO2 25 26 28   GLUCOSE 91 100* 117*  BUN 9 5* 6  CREATININE 0.76 0.86 0.83  CALCIUM 8.2* 8.1* 8.5*   GFR: Estimated Creatinine Clearance: 159.2 mL/min (by C-G formula based on SCr of 0.83 mg/dL). Liver Function Tests:  Recent Labs Lab 12/04/16 0459  AST 23  ALT 33  ALKPHOS 143*  BILITOT 0.5  PROT 5.7*  ALBUMIN 2.2*   No results for input(s): LIPASE, AMYLASE in the last 168 hours. No results for input(s): AMMONIA in the last 168 hours. Coagulation Profile:  Recent Labs Lab 12/03/16 1553  INR 0.98   Cardiac Enzymes: No results for input(s): CKTOTAL, CKMB, CKMBINDEX, TROPONINI in the last 168 hours. BNP (last 3 results) No results for input(s): PROBNP in the last 8760 hours. HbA1C: No results for input(s): HGBA1C in the last 72 hours. CBG: No results for input(s): GLUCAP in the last 168 hours. Lipid Profile: No results for input(s): CHOL, HDL, LDLCALC, TRIG, CHOLHDL, LDLDIRECT in the last 72 hours. Thyroid Function Tests: No results for input(s): TSH, T4TOTAL, FREET4, T3FREE, THYROIDAB in the last 72 hours. Anemia Panel: No results for input(s): VITAMINB12, FOLATE, FERRITIN, TIBC, IRON, RETICCTPCT in the last 72 hours. Urine analysis:    Component Value Date/Time   COLORURINE YELLOW 03/21/2014 1109   APPEARANCEUR CLEAR 03/21/2014 1109   LABSPEC 1.022 03/21/2014 1109   PHURINE 7.0 03/21/2014 1109   GLUCOSEU NEGATIVE 03/21/2014 1109   HGBUR NEGATIVE 03/21/2014 1109   BILIRUBINUR NEGATIVE 03/21/2014 1109   KETONESUR NEGATIVE 03/21/2014 1109   PROTEINUR NEGATIVE 03/21/2014  1109   UROBILINOGEN 0.2 03/21/2014 1109   NITRITE NEGATIVE 03/21/2014 1109   LEUKOCYTESUR NEGATIVE 03/21/2014 1109   Sepsis Labs: Invalid input(s): PROCALCITONIN, LACTICIDVEN  Recent Results (from the past 240 hour(s))  Blood culture (routine x 2)     Status: Abnormal   Collection Time: 12/03/16  3:48 PM  Result Value Ref Range Status   Specimen Description BLOOD RIGHT ANTECUBITAL  Final   Special Requests BOTTLES DRAWN AEROBIC AND ANAEROBIC 5CC  Final   Culture  Setup Time   Final    GRAM POSITIVE COCCI IN CLUSTERS IN BOTH AEROBIC AND ANAEROBIC BOTTLES CRITICAL VALUE NOTED.  VALUE IS CONSISTENT WITH PREVIOUSLY REPORTED AND CALLED VALUE.    Culture (A)  Final    STAPHYLOCOCCUS AUREUS SUSCEPTIBILITIES PERFORMED ON PREVIOUS CULTURE WITHIN THE LAST 5  DAYS.    Report Status 12/06/2016 FINAL  Final  Blood culture (routine x 2)     Status: Abnormal   Collection Time: 12/03/16  3:56 PM  Result Value Ref Range Status   Specimen Description BLOOD RIGHT HAND  Final   Special Requests BOTTLES DRAWN AEROBIC AND ANAEROBIC 5CC  Final   Culture  Setup Time   Final    GRAM POSITIVE COCCI IN CLUSTERS IN BOTH AEROBIC AND ANAEROBIC BOTTLES CRITICAL RESULT CALLED TO, READ BACK BY AND VERIFIED WITH: TONYA JOHNSON,PHARMD @0724  12/04/16 MKELLY,MLT    Culture STAPHYLOCOCCUS AUREUS (A)  Final   Report Status 12/06/2016 FINAL  Final   Organism ID, Bacteria STAPHYLOCOCCUS AUREUS  Final      Susceptibility   Staphylococcus aureus - MIC*    CIPROFLOXACIN <=0.5 SENSITIVE Sensitive     ERYTHROMYCIN <=0.25 SENSITIVE Sensitive     GENTAMICIN <=0.5 SENSITIVE Sensitive     OXACILLIN <=0.25 SENSITIVE Sensitive     TETRACYCLINE <=1 SENSITIVE Sensitive     VANCOMYCIN <=0.5 SENSITIVE Sensitive     TRIMETH/SULFA <=10 SENSITIVE Sensitive     CLINDAMYCIN <=0.25 SENSITIVE Sensitive     RIFAMPIN <=0.5 SENSITIVE Sensitive     Inducible Clindamycin NEGATIVE Sensitive     * STAPHYLOCOCCUS AUREUS  Blood Culture ID  Panel (Reflexed)     Status: Abnormal   Collection Time: 12/03/16  3:56 PM  Result Value Ref Range Status   Enterococcus species NOT DETECTED NOT DETECTED Final   Listeria monocytogenes NOT DETECTED NOT DETECTED Final   Staphylococcus species DETECTED (A) NOT DETECTED Final    Comment: CRITICAL RESULT CALLED TO, READ BACK BY AND VERIFIED WITH: Renard Hamper, PHARMD @0724  12/04/16 MKELLY,MLT    Staphylococcus aureus DETECTED (A) NOT DETECTED Final    Comment: CRITICAL RESULT CALLED TO, READ BACK BY AND VERIFIED WITH: Renard Hamper, PHARMD @0724  12/04/16 MKELLY,MLT    Methicillin resistance NOT DETECTED NOT DETECTED Final   Streptococcus species NOT DETECTED NOT DETECTED Final   Streptococcus agalactiae NOT DETECTED NOT DETECTED Final   Streptococcus pneumoniae NOT DETECTED NOT DETECTED Final   Streptococcus pyogenes NOT DETECTED NOT DETECTED Final   Acinetobacter baumannii NOT DETECTED NOT DETECTED Final   Enterobacteriaceae species NOT DETECTED NOT DETECTED Final   Enterobacter cloacae complex NOT DETECTED NOT DETECTED Final   Escherichia coli NOT DETECTED NOT DETECTED Final   Klebsiella oxytoca NOT DETECTED NOT DETECTED Final   Klebsiella pneumoniae NOT DETECTED NOT DETECTED Final   Proteus species NOT DETECTED NOT DETECTED Final   Serratia marcescens NOT DETECTED NOT DETECTED Final   Haemophilus influenzae NOT DETECTED NOT DETECTED Final   Neisseria meningitidis NOT DETECTED NOT DETECTED Final   Pseudomonas aeruginosa NOT DETECTED NOT DETECTED Final   Candida albicans NOT DETECTED NOT DETECTED Final   Candida glabrata NOT DETECTED NOT DETECTED Final   Candida krusei NOT DETECTED NOT DETECTED Final   Candida parapsilosis NOT DETECTED NOT DETECTED Final   Candida tropicalis NOT DETECTED NOT DETECTED Final  Aerobic/Anaerobic Culture (surgical/deep wound)     Status: None   Collection Time: 12/03/16  7:14 PM  Result Value Ref Range Status   Specimen Description WOUND  Final    Special Requests SWAB OF LEFT PARASPINAL PT ON ANCEF VANC SAMPLE A  Final   Gram Stain   Final    RARE WBC PRESENT,BOTH PMN AND MONONUCLEAR FEW GRAM POSITIVE COCCI IN CLUSTERS    Culture   Final    ABUNDANT STAPHYLOCOCCUS AUREUS NO ANAEROBES  ISOLATED    Report Status 12/08/2016 FINAL  Final   Organism ID, Bacteria STAPHYLOCOCCUS AUREUS  Final      Susceptibility   Staphylococcus aureus - MIC*    CIPROFLOXACIN <=0.5 SENSITIVE Sensitive     ERYTHROMYCIN 0.5 SENSITIVE Sensitive     GENTAMICIN <=0.5 SENSITIVE Sensitive     OXACILLIN <=0.25 SENSITIVE Sensitive     TETRACYCLINE <=1 SENSITIVE Sensitive     VANCOMYCIN <=0.5 SENSITIVE Sensitive     TRIMETH/SULFA <=10 SENSITIVE Sensitive     CLINDAMYCIN <=0.25 SENSITIVE Sensitive     RIFAMPIN <=0.5 SENSITIVE Sensitive     Inducible Clindamycin NEGATIVE Sensitive     * ABUNDANT STAPHYLOCOCCUS AUREUS  Aerobic/Anaerobic Culture (surgical/deep wound)     Status: None   Collection Time: 12/03/16  7:16 PM  Result Value Ref Range Status   Specimen Description WOUND  Final   Special Requests LEFT PARASPINAL SWAB PT ON VANC ANCEF SAMPLE B  Final   Gram Stain   Final    MODERATE WBC PRESENT,BOTH PMN AND MONONUCLEAR MODERATE GRAM POSITIVE COCCI IN CLUSTERS    Culture   Final    ABUNDANT STAPHYLOCOCCUS AUREUS SUSCEPTIBILITIES PERFORMED ON PREVIOUS CULTURE WITHIN THE LAST 5 DAYS. NO ANAEROBES ISOLATED    Report Status 12/08/2016 FINAL  Final  Aerobic/Anaerobic Culture (surgical/deep wound)     Status: None   Collection Time: 12/03/16  7:21 PM  Result Value Ref Range Status   Specimen Description WOUND  Final   Special Requests LEFT LUMBAR FACET 4,5 PT ON VANC ANCEF SAMPLE C  Final   Gram Stain   Final    FEW WBC PRESENT, PREDOMINANTLY PMN RARE GRAM POSITIVE COCCI IN PAIRS    Culture   Final    FEW STAPHYLOCOCCUS AUREUS SUSCEPTIBILITIES PERFORMED ON PREVIOUS CULTURE WITHIN THE LAST 5 DAYS. NO ANAEROBES ISOLATED    Report Status  12/08/2016 FINAL  Final  Aerobic/Anaerobic Culture (surgical/deep wound)     Status: None   Collection Time: 12/03/16  7:28 PM  Result Value Ref Range Status   Specimen Description WOUND  Final   Special Requests   Final    LEFT LUMBAR FOUR EPIDURAL SPACE PT ON VANC ANCEF SAMPLE D   Gram Stain   Final    RARE WBC PRESENT,BOTH PMN AND MONONUCLEAR RARE GRAM POSITIVE COCCI IN CLUSTERS    Culture   Final    ABUNDANT STAPHYLOCOCCUS AUREUS SUSCEPTIBILITIES PERFORMED ON PREVIOUS CULTURE WITHIN THE LAST 5 DAYS. NO ANAEROBES ISOLATED    Report Status 12/08/2016 FINAL  Final  Culture, blood (Routine X 2) w Reflex to ID Panel     Status: None (Preliminary result)   Collection Time: 12/04/16  2:10 PM  Result Value Ref Range Status   Specimen Description BLOOD LEFT ARM  Final   Special Requests BOTTLES DRAWN AEROBIC AND ANAEROBIC 5CC  Final   Culture NO GROWTH 4 DAYS  Final   Report Status PENDING  Incomplete  Culture, blood (Routine X 2) w Reflex to ID Panel     Status: None (Preliminary result)   Collection Time: 12/04/16  2:20 PM  Result Value Ref Range Status   Specimen Description BLOOD LEFT HAND  Final   Special Requests BOTTLES DRAWN AEROBIC AND ANAEROBIC 5CC  Final   Culture NO GROWTH 4 DAYS  Final   Report Status PENDING  Incomplete  Culture, blood (Routine X 2) w Reflex to ID Panel     Status: None (Preliminary result)   Collection Time:  12/06/16  3:25 PM  Result Value Ref Range Status   Specimen Description BLOOD LEFT ANTECUBITAL  Final   Special Requests BOTTLES DRAWN AEROBIC ONLY  Final   Culture NO GROWTH 2 DAYS  Final   Report Status PENDING  Incomplete  Culture, blood (Routine X 2) w Reflex to ID Panel     Status: None (Preliminary result)   Collection Time: 12/06/16  3:29 PM  Result Value Ref Range Status   Specimen Description BLOOD LEFT ANTECUBITAL  Final   Special Requests BOTTLES DRAWN AEROBIC ONLY   Final   Culture NO GROWTH 2 DAYS  Final   Report Status  PENDING  Incomplete     Radiology Studies: No results found.  Scheduled Meds: . calcium carbonate  1 tablet Oral TID WC  .  ceFAZolin (ANCEF) IV  2 g Intravenous Q8H  . LORazepam  2 mg Oral UD  . nicotine  21 mg Transdermal Daily  . senna-docusate  2 tablet Oral QHS  . sodium chloride flush  3 mL Intravenous Q12H   Continuous Infusions: . sodium chloride 250 mL (12/09/16 0837)    Pamella Pert, MD, PhD [facility] Pager [phone]  If 7PM-7AM, please contact night-coverage [email or web address] Password [email or web address] 12/09/2016, 10:48 AM

## 2016-12-09 NOTE — Transfer of Care (Signed)
Immediate Anesthesia Transfer of Care Note  Patient: Larry Best  Procedure(s) Performed: Procedure(s): TRANSESOPHAGEAL ECHOCARDIOGRAM (TEE) (N/A)  Patient Location: PACU  Anesthesia Type:MAC  Level of Consciousness: awake, alert  and patient cooperative  Airway & Oxygen Therapy: Patient Spontanous Breathing and Patient connected to nasal cannula oxygen  Post-op Assessment: Report given to RN and Post -op Vital signs reviewed and stable  Post vital signs: Reviewed and stable  Last Vitals:  Vitals:   12/09/16 1028 12/09/16 1225  BP: (!) 127/55 (!) 131/49  Pulse: 94 (!) 105  Resp: 17 18  Temp: 37 C 36.8 C    Last Pain:  Vitals:   12/09/16 1225  TempSrc: Oral  PainSc: 9       Patients Stated Pain Goal: 1 (12/09/16 0831)  Complications: No apparent anesthesia complications

## 2016-12-09 NOTE — Anesthesia Postprocedure Evaluation (Signed)
Anesthesia Post Note  Patient: Larry Best  Procedure(s) Performed: Procedure(s) (LRB): TRANSESOPHAGEAL ECHOCARDIOGRAM (TEE) (N/A)  Patient location during evaluation: PACU Anesthesia Type: MAC Level of consciousness: awake and alert Pain management: pain level controlled Vital Signs Assessment: post-procedure vital signs reviewed and stable Respiratory status: spontaneous breathing Cardiovascular status: stable Anesthetic complications: no       Last Vitals:  Vitals:   12/09/16 1310 12/09/16 1320  BP: 117/69 106/62  Pulse: 100 (!) 102  Resp: 17 16  Temp:      Last Pain:  Vitals:   12/09/16 1303  TempSrc: Oral  PainSc:                  Lewie LoronJohn Bryne Lindon

## 2016-12-09 NOTE — Anesthesia Procedure Notes (Signed)
Procedure Name: MAC Date/Time: 12/09/2016 12:43 PM Performed by: Faustino CongressWHITE, Larry Best Pre-anesthesia Checklist: Patient identified, Emergency Drugs available, Suction available, Patient being monitored and Timeout performed Patient Re-evaluated:Patient Re-evaluated prior to inductionOxygen Delivery Method: Nasal cannula

## 2016-12-09 NOTE — Progress Notes (Signed)
INFECTIOUS DISEASE PROGRESS NOTE  ID: Larry Best is a 30 y.o. male with  Principal Problem:   Spinal epidural abscess Active Problems:   Abscess in epidural space of lumbar spine   IVDU (intravenous drug user)   Staphylococcus aureus bacteremia   MSSA (methicillin susceptible Staphylococcus aureus) infection   Headache   Pyomyositis  Subjective: C/o pain.   Abtx:  Anti-infectives    Start     Dose/Rate Route Frequency Ordered Stop   12/05/16 0500  ceFAZolin (ANCEF) IVPB 2g/100 mL premix     2 g 200 mL/hr over 30 Minutes Intravenous Every 8 hours 12/04/16 2204     12/04/16 1730  ceFAZolin (ANCEF) IVPB 2g/100 mL premix  Status:  Discontinued     2 g 200 mL/hr over 30 Minutes Intravenous Every 8 hours 12/04/16 1432 12/04/16 2204   12/04/16 0300  vancomycin (VANCOCIN) 1,250 mg in sodium chloride 0.9 % 250 mL IVPB  Status:  Discontinued     1,250 mg 166.7 mL/hr over 90 Minutes Intravenous Every 8 hours 12/03/16 2330 12/04/16 0934   12/03/16 2359  ceFAZolin (ANCEF) IVPB 2g/100 mL premix     2 g 200 mL/hr over 30 Minutes Intravenous Every 8 hours 12/03/16 2245 12/04/16 0958   12/03/16 1946  vancomycin (VANCOCIN) powder  Status:  Discontinued       As needed 12/03/16 1947 12/03/16 2043   12/03/16 1855  50,000 units bacitracin in 0.9% normal saline 250 mL irrigation  Status:  Discontinued       As needed 12/03/16 1855 12/03/16 2043      Medications:  Scheduled: . calcium carbonate  1 tablet Oral TID WC  .  ceFAZolin (ANCEF) IV  2 g Intravenous Q8H  . LORazepam  2 mg Oral UD  . nicotine  21 mg Transdermal Daily  . senna-docusate  2 tablet Oral QHS  . sodium chloride flush  3 mL Intravenous Q12H    Objective: Vital signs in last 24 hours: Temp:  [98.1 F (36.7 C)-101.2 F (38.4 C)] 98.1 F (36.7 C) (12/28 1303) Pulse Rate:  [92-123] 102 (12/28 1320) Resp:  [16-20] 16 (12/28 1320) BP: (106-149)/(49-69) 106/62 (12/28 1320) SpO2:  [94 %-100 %] 100 % (12/28  1320) Weight:  [108.2 kg (238 lb 8.6 oz)] 108.2 kg (238 lb 8.6 oz) (12/28 1225)   General appearance: alert, cooperative and no distress Skin: no gluteal rash, hyperesthetic.   Lab Results  Recent Labs  12/07/16 0743  WBC 19.6*  HGB 10.6*  HCT 31.7*  NA 137  K 4.4  CL 99*  CO2 28  BUN 6  CREATININE 0.83   Liver Panel No results for input(s): PROT, ALBUMIN, AST, ALT, ALKPHOS, BILITOT, BILIDIR, IBILI in the last 72 hours. Sedimentation Rate No results for input(s): ESRSEDRATE in the last 72 hours. C-Reactive Protein No results for input(s): CRP in the last 72 hours.  Microbiology: Recent Results (from the past 240 hour(s))  Blood culture (routine x 2)     Status: Abnormal   Collection Time: 12/03/16  3:48 PM  Result Value Ref Range Status   Specimen Description BLOOD RIGHT ANTECUBITAL  Final   Special Requests BOTTLES DRAWN AEROBIC AND ANAEROBIC 5CC  Final   Culture  Setup Time   Final    GRAM POSITIVE COCCI IN CLUSTERS IN BOTH AEROBIC AND ANAEROBIC BOTTLES CRITICAL VALUE NOTED.  VALUE IS CONSISTENT WITH PREVIOUSLY REPORTED AND CALLED VALUE.    Culture (A)  Final  STAPHYLOCOCCUS AUREUS SUSCEPTIBILITIES PERFORMED ON PREVIOUS CULTURE WITHIN THE LAST 5 DAYS.    Report Status 12/06/2016 FINAL  Final  Blood culture (routine x 2)     Status: Abnormal   Collection Time: 12/03/16  3:56 PM  Result Value Ref Range Status   Specimen Description BLOOD RIGHT HAND  Final   Special Requests BOTTLES DRAWN AEROBIC AND ANAEROBIC 5CC  Final   Culture  Setup Time   Final    GRAM POSITIVE COCCI IN CLUSTERS IN BOTH AEROBIC AND ANAEROBIC BOTTLES CRITICAL RESULT CALLED TO, READ BACK BY AND VERIFIED WITH: TONYA JOHNSON,PHARMD @0724  12/04/16 MKELLY,MLT    Culture STAPHYLOCOCCUS AUREUS (A)  Final   Report Status 12/06/2016 FINAL  Final   Organism ID, Bacteria STAPHYLOCOCCUS AUREUS  Final      Susceptibility   Staphylococcus aureus - MIC*    CIPROFLOXACIN <=0.5 SENSITIVE Sensitive      ERYTHROMYCIN <=0.25 SENSITIVE Sensitive     GENTAMICIN <=0.5 SENSITIVE Sensitive     OXACILLIN <=0.25 SENSITIVE Sensitive     TETRACYCLINE <=1 SENSITIVE Sensitive     VANCOMYCIN <=0.5 SENSITIVE Sensitive     TRIMETH/SULFA <=10 SENSITIVE Sensitive     CLINDAMYCIN <=0.25 SENSITIVE Sensitive     RIFAMPIN <=0.5 SENSITIVE Sensitive     Inducible Clindamycin NEGATIVE Sensitive     * STAPHYLOCOCCUS AUREUS  Blood Culture ID Panel (Reflexed)     Status: Abnormal   Collection Time: 12/03/16  3:56 PM  Result Value Ref Range Status   Enterococcus species NOT DETECTED NOT DETECTED Final   Listeria monocytogenes NOT DETECTED NOT DETECTED Final   Staphylococcus species DETECTED (A) NOT DETECTED Final    Comment: CRITICAL RESULT CALLED TO, READ BACK BY AND VERIFIED WITH: Renard Hamper, PHARMD @0724  12/04/16 MKELLY,MLT    Staphylococcus aureus DETECTED (A) NOT DETECTED Final    Comment: CRITICAL RESULT CALLED TO, READ BACK BY AND VERIFIED WITH: Renard Hamper, PHARMD @0724  12/04/16 MKELLY,MLT    Methicillin resistance NOT DETECTED NOT DETECTED Final   Streptococcus species NOT DETECTED NOT DETECTED Final   Streptococcus agalactiae NOT DETECTED NOT DETECTED Final   Streptococcus pneumoniae NOT DETECTED NOT DETECTED Final   Streptococcus pyogenes NOT DETECTED NOT DETECTED Final   Acinetobacter baumannii NOT DETECTED NOT DETECTED Final   Enterobacteriaceae species NOT DETECTED NOT DETECTED Final   Enterobacter cloacae complex NOT DETECTED NOT DETECTED Final   Escherichia coli NOT DETECTED NOT DETECTED Final   Klebsiella oxytoca NOT DETECTED NOT DETECTED Final   Klebsiella pneumoniae NOT DETECTED NOT DETECTED Final   Proteus species NOT DETECTED NOT DETECTED Final   Serratia marcescens NOT DETECTED NOT DETECTED Final   Haemophilus influenzae NOT DETECTED NOT DETECTED Final   Neisseria meningitidis NOT DETECTED NOT DETECTED Final   Pseudomonas aeruginosa NOT DETECTED NOT DETECTED Final   Candida  albicans NOT DETECTED NOT DETECTED Final   Candida glabrata NOT DETECTED NOT DETECTED Final   Candida krusei NOT DETECTED NOT DETECTED Final   Candida parapsilosis NOT DETECTED NOT DETECTED Final   Candida tropicalis NOT DETECTED NOT DETECTED Final  Aerobic/Anaerobic Culture (surgical/deep wound)     Status: None   Collection Time: 12/03/16  7:14 PM  Result Value Ref Range Status   Specimen Description WOUND  Final   Special Requests SWAB OF LEFT PARASPINAL PT ON ANCEF VANC SAMPLE A  Final   Gram Stain   Final    RARE WBC PRESENT,BOTH PMN AND MONONUCLEAR FEW GRAM POSITIVE COCCI IN CLUSTERS    Culture  Final    ABUNDANT STAPHYLOCOCCUS AUREUS NO ANAEROBES ISOLATED    Report Status 12/08/2016 FINAL  Final   Organism ID, Bacteria STAPHYLOCOCCUS AUREUS  Final      Susceptibility   Staphylococcus aureus - MIC*    CIPROFLOXACIN <=0.5 SENSITIVE Sensitive     ERYTHROMYCIN 0.5 SENSITIVE Sensitive     GENTAMICIN <=0.5 SENSITIVE Sensitive     OXACILLIN <=0.25 SENSITIVE Sensitive     TETRACYCLINE <=1 SENSITIVE Sensitive     VANCOMYCIN <=0.5 SENSITIVE Sensitive     TRIMETH/SULFA <=10 SENSITIVE Sensitive     CLINDAMYCIN <=0.25 SENSITIVE Sensitive     RIFAMPIN <=0.5 SENSITIVE Sensitive     Inducible Clindamycin NEGATIVE Sensitive     * ABUNDANT STAPHYLOCOCCUS AUREUS  Aerobic/Anaerobic Culture (surgical/deep wound)     Status: None   Collection Time: 12/03/16  7:16 PM  Result Value Ref Range Status   Specimen Description WOUND  Final   Special Requests LEFT PARASPINAL SWAB PT ON VANC ANCEF SAMPLE B  Final   Gram Stain   Final    MODERATE WBC PRESENT,BOTH PMN AND MONONUCLEAR MODERATE GRAM POSITIVE COCCI IN CLUSTERS    Culture   Final    ABUNDANT STAPHYLOCOCCUS AUREUS SUSCEPTIBILITIES PERFORMED ON PREVIOUS CULTURE WITHIN THE LAST 5 DAYS. NO ANAEROBES ISOLATED    Report Status 12/08/2016 FINAL  Final  Aerobic/Anaerobic Culture (surgical/deep wound)     Status: None   Collection Time:  12/03/16  7:21 PM  Result Value Ref Range Status   Specimen Description WOUND  Final   Special Requests LEFT LUMBAR FACET 4,5 PT ON VANC ANCEF SAMPLE C  Final   Gram Stain   Final    FEW WBC PRESENT, PREDOMINANTLY PMN RARE GRAM POSITIVE COCCI IN PAIRS    Culture   Final    FEW STAPHYLOCOCCUS AUREUS SUSCEPTIBILITIES PERFORMED ON PREVIOUS CULTURE WITHIN THE LAST 5 DAYS. NO ANAEROBES ISOLATED    Report Status 12/08/2016 FINAL  Final  Aerobic/Anaerobic Culture (surgical/deep wound)     Status: None   Collection Time: 12/03/16  7:28 PM  Result Value Ref Range Status   Specimen Description WOUND  Final   Special Requests   Final    LEFT LUMBAR FOUR EPIDURAL SPACE PT ON VANC ANCEF SAMPLE D   Gram Stain   Final    RARE WBC PRESENT,BOTH PMN AND MONONUCLEAR RARE GRAM POSITIVE COCCI IN CLUSTERS    Culture   Final    ABUNDANT STAPHYLOCOCCUS AUREUS SUSCEPTIBILITIES PERFORMED ON PREVIOUS CULTURE WITHIN THE LAST 5 DAYS. NO ANAEROBES ISOLATED    Report Status 12/08/2016 FINAL  Final  Culture, blood (Routine X 2) w Reflex to ID Panel     Status: None   Collection Time: 12/04/16  2:10 PM  Result Value Ref Range Status   Specimen Description BLOOD LEFT ARM  Final   Special Requests BOTTLES DRAWN AEROBIC AND ANAEROBIC 5CC  Final   Culture NO GROWTH 5 DAYS  Final   Report Status 12/09/2016 FINAL  Final  Culture, blood (Routine X 2) w Reflex to ID Panel     Status: None   Collection Time: 12/04/16  2:20 PM  Result Value Ref Range Status   Specimen Description BLOOD LEFT HAND  Final   Special Requests BOTTLES DRAWN AEROBIC AND ANAEROBIC 5CC  Final   Culture NO GROWTH 5 DAYS  Final   Report Status 12/09/2016 FINAL  Final  Culture, blood (Routine X 2) w Reflex to ID Panel     Status:  None (Preliminary result)   Collection Time: 12/06/16  3:25 PM  Result Value Ref Range Status   Specimen Description BLOOD LEFT ANTECUBITAL  Final   Special Requests BOTTLES DRAWN AEROBIC ONLY  Final    Culture NO GROWTH 3 DAYS  Final   Report Status PENDING  Incomplete  Culture, blood (Routine X 2) w Reflex to ID Panel     Status: None (Preliminary result)   Collection Time: 12/06/16  3:29 PM  Result Value Ref Range Status   Specimen Description BLOOD LEFT ANTECUBITAL  Final   Special Requests BOTTLES DRAWN AEROBIC ONLY   Final   Culture NO GROWTH 3 DAYS  Final   Report Status PENDING  Incomplete    Studies/Results: No results found.   Assessment/Plan: Back pain Epidural Abscess MSSA bacteremia Fever Hepatitis C immune (Ab+, RNA-)  Repeat BCx are negative.  TEE (-) Would send pt home on keflex 1g bid for 60 days Available as needed.   Discussed with wife in detail.   Total days of antibiotics: 6 ancef                Johny Sax Infectious Diseases (pager) [phone] [email or web address] 12/09/2016, 1:48 PM  LOS: 6 days

## 2016-12-09 NOTE — Op Note (Signed)
INDICATIONS: infective endocarditis  PROCEDURE:   Informed consent was obtained prior to the procedure. The risks, benefits and alternatives for the procedure were discussed and the patient comprehended these risks.  Risks include, but are not limited to, cough, sore throat, vomiting, nausea, somnolence, esophageal and stomach trauma or perforation, bleeding, low blood pressure, aspiration, pneumonia, infection, trauma to the teeth and death.    After a procedural time-out, the oropharynx was anesthetized with 20% benzocaine spray.   During this procedure the patient was administered IV propofol by Anesthesiology.  The transesophageal probe was inserted in the esophagus and stomach without difficulty and multiple views were obtained.  The patient was kept under observation until the patient left the procedure room.  The patient left the procedure room in stable condition.   Agitated microbubble saline contrast was not administered.  COMPLICATIONS:    There were no immediate complications.  FINDINGS:  Normal TEE.  RECOMMENDATIONS:     No evidence of endocarditis.  Time Spent Directly with the Patient:  30 minutes   Larry Best 12/09/2016, 12:55 PM

## 2016-12-09 NOTE — Progress Notes (Signed)
Physical Therapy Discharge Patient Details Name: Larry Best MRN: [id number] DOB: Sep 03, 1986 Today's Date: 12/09/2016 Time: 5672-0919 PT Time Calculation (min) (ACUTE ONLY): 23 min  Patient discharged from PT services secondary to goals met and no further PT needs identified.  Please see latest therapy progress note for current level of functioning and progress toward goals.    Progress and discharge plan discussed with patient and/or caregiver: Patient/Caregiver agrees with plan  GP     Randle Shatzer Eli Hose    Will inform supervising PT to address goals and POC at this time. 12/09/2016, 11:56 AM

## 2016-12-09 NOTE — Anesthesia Preprocedure Evaluation (Addendum)
Anesthesia Evaluation  Patient identified by MRN, date of birth, ID band Patient awake    Reviewed: Allergy & Precautions, H&P , NPO status , Patient's Chart, lab work & pertinent test results  Airway Mallampati: II  TM Distance: >3 FB Neck ROM: Full    Dental no notable dental hx. (+) Teeth Intact, Dental Advisory Given   Pulmonary Current Smoker,    Pulmonary exam normal breath sounds clear to auscultation       Cardiovascular negative cardio ROS   Rhythm:Regular Rate:Normal     Neuro/Psych  Headaches, negative neurological ROS  negative psych ROS   GI/Hepatic negative GI ROS, (+)     substance abuse  IV drug use,   Endo/Other  negative endocrine ROS  Renal/GU negative Renal ROS     Musculoskeletal   Abdominal   Peds  Hematology negative hematology ROS (+)   Anesthesia Other Findings   Reproductive/Obstetrics negative OB ROS                            Anesthesia Physical  Anesthesia Plan  ASA: II  Anesthesia Plan: MAC   Post-op Pain Management:    Induction: Intravenous  Airway Management Planned:   Additional Equipment:   Intra-op Plan:   Post-operative Plan:   Informed Consent: I have reviewed the patients History and Physical, chart, labs and discussed the procedure including the risks, benefits and alternatives for the proposed anesthesia with the patient or authorized representative who has indicated his/her understanding and acceptance.   Dental advisory given  Plan Discussed with: CRNA  Anesthesia Plan Comments:         Anesthesia Quick Evaluation

## 2016-12-09 NOTE — Progress Notes (Signed)
Occupational Therapy Treatment and Discharge Patient Details Name: Larry Best MRN: [id number] DOB: 14-Oct-1986 Today's Date: 12/09/2016    History of present illness 30 yo male, s/p fall with persistent back pain.  Presented to ED with lumbar fluid accumulation, abcess, and infection.  Now post L4-S1 Laminectomies.   OT comments  Pt is performing mobility, ADL and ADL transfers at a modified independent to independent level. No further OT needs.  Follow Up Recommendations  No OT follow up    Equipment Recommendations  None recommended by OT    Recommendations for Other Services      Precautions / Restrictions Precautions Precautions: Fall;Back Precaution Booklet Issued: Yes (comment) Precaution Comments: reviewed back precautions for comfort.   Restrictions Weight Bearing Restrictions: No       Mobility Bed Mobility Overal bed mobility: Modified Independent Bed Mobility: Rolling;Sidelying to Sit Rolling: Independent Sidelying to sit: Modified independent (Device/Increase time) (with use of rail)       General bed mobility comments: Pt withy good carryover including log rolling and avoid twisting during sidelying to sit.    Transfers Overall transfer level: Modified independent Equipment used: None Transfers: Sit to/from Stand Sit to Stand: Independent         General transfer comment: increased time, no assist    Balance     Sitting balance-Leahy Scale: Normal       Standing balance-Leahy Scale: Normal                     ADL Overall ADL's : Needs assistance/impaired     Grooming: Wash/dry hands;Standing;Independent         Lower Body Bathing Details (indicate cue type and reason): recommended long handled bath sponge Upper Body Dressing : Set up;Sitting   Lower Body Dressing: Modified independent;Sit to/from stand Lower Body Dressing Details (indicate cue type and reason): issued sock aid and instructed in use, pt able to don  pants, doffs socks with opposite foot Toilet Transfer: Independent   Toileting- Clothing Manipulation and Hygiene: Independent;Sit to/from stand   Tub/ Shower Transfer: Tub transfer;Independent   Functional mobility during ADLs: Independent        Vision                     Perception     Praxis      Cognition   Behavior During Therapy: WFL for tasks assessed/performed Overall Cognitive Status: Within Functional Limits for tasks assessed                       Extremity/Trunk Assessment               Exercises     Shoulder Instructions       General Comments      Pertinent Vitals/ Pain       Pain Assessment: Faces Faces Pain Scale: Hurts little more Pain Location: sacral pain Pain Descriptors / Indicators: Discomfort Pain Intervention(s): Monitored during session  Home Living                                          Prior Functioning/Environment              Frequency           Progress Toward Goals  OT Goals(current goals can now be found in the care plan  section)  Progress towards OT goals: Goals met/education completed, patient discharged from OT  Acute Rehab OT Goals Patient Stated Goal: to drink a yoohoo  Plan All goals met and education completed, patient discharged from OT services    Co-evaluation      Reason for Co-Treatment: Necessary to address cognition/behavior during functional activity (Pt refusing multiple therapies previously.  Necessary to perform in co treatment to address POCs for PT and OT.  )          End of Session     Activity Tolerance Patient tolerated treatment well   Patient Left in bed;with call bell/phone within reach;with family/visitor present   Nurse Communication          Time: 3382-5053 OT Time Calculation (min): 23 min  Charges: OT General Charges $OT Visit: 1 Procedure OT Treatments $Self Care/Home Management : 8-22 mins  Malka So 12/09/2016, 12:47 PM  [phone]

## 2016-12-09 NOTE — Progress Notes (Signed)
Patient states h/a is 0/10, "completely gone and back pain is 1/10, Feels like a sore muscle." Patient received a one time dose of Fioricet PO at 0103, see MAR. Patient did not receive andy IV dilaudid in the last 13 hours. RN will continue to monitor.

## 2016-12-09 NOTE — Progress Notes (Addendum)
Physical Therapy Treatment Patient Details Name: Larry Best MRN: [id number] DOB: Oct 10, 1986 Today's Date: 12/09/2016    History of Present Illness 30 yo male, s/p fall with persistent back pain.  Presented to ED with lumbar fluid accumulation, abcess, and infection.  Now post L4-S1 Laminectomies.    PT Comments    Pt performed increased mobility.  PTA addressed all goals during tx including review of back precautions, mobility, gait and stair training.  Pt performing all mobility with independence and will no longer require HHPT at d/c.  Will inform supervising PT of patient's progress and need to d/c from therapy services at this time.     Follow Up Recommendations  No PT follow up     Equipment Recommendations   (no equipment needs.  )    Recommendations for Other Services       Precautions / Restrictions Precautions Precautions: Fall;Back Precaution Booklet Issued: Yes (comment) Precaution Comments: reviewed back precautions for comfort.   Restrictions Weight Bearing Restrictions: No    Mobility  Bed Mobility Overal bed mobility: Modified Independent Bed Mobility: Rolling;Sidelying to Sit Rolling: Independent Sidelying to sit: Modified independent (Device/Increase time) (with use of rail)       General bed mobility comments: Pt withy good carryover including log rolling and avoid twisting during sidelying to sit.    Transfers Overall transfer level: Independent Equipment used: None Transfers: Sit to/from Stand Sit to Stand: Independent         General transfer comment: Good technique observed, no assist or cueing needed.    Ambulation/Gait Ambulation/Gait assistance: Independent Ambulation Distance (Feet): 125 Feet Assistive device: None Gait Pattern/deviations: Step-through pattern;WFL(Within Functional Limits)   Gait velocity interpretation: at or above normal speed for age/gender General Gait Details: No gait deviations observed.  Good  reciprocal armswing.     Stairs Stairs: Yes   Stair Management: No rails;Alternating pattern Number of Stairs: 5 General stair comments: No use of rail and good technique observed.    Wheelchair Mobility    Modified Rankin (Stroke Patients Only)       Balance     Sitting balance-Leahy Scale: Normal       Standing balance-Leahy Scale: Normal                      Cognition Arousal/Alertness: Awake/alert Behavior During Therapy: WFL for tasks assessed/performed Overall Cognitive Status: Within Functional Limits for tasks assessed                      Exercises      General Comments        Pertinent Vitals/Pain Pain Assessment: Faces Faces Pain Scale: Hurts little more Pain Location: sacral pain Pain Descriptors / Indicators: Discomfort Pain Intervention(s): Monitored during session    Home Living                      Prior Function            PT Goals (current goals can now be found in the care plan section) Acute Rehab PT Goals Patient Stated Goal: To smoke a cigarette (Pt educated on the benefits of cessations and how smoking will slow the healing process.  ) Potential to Achieve Goals: Good Progress towards PT goals: Goals met/education completed, patient discharged from PT    Frequency           PT Plan Discharge plan needs to be updated    Co-evaluation  PT/OT/SLP Co-Evaluation/Treatment: Yes Reason for Co-Treatment: Necessary to address cognition/behavior during functional activity (Pt refusing multiple therapies previously.  Necessary to perform in co treatment to address POCs for PT and OT.  )         End of Session   Activity Tolerance: Patient tolerated treatment well Patient left: in bed;with call bell/phone within reach;with family/visitor present     Time: 5631-4970 PT Time Calculation (min) (ACUTE ONLY): 23 min  Charges:  $Gait Training: 8-22 mins                    G Codes:      Cristela Blue 01-06-17, 11:54 AM  Governor Rooks, PTA pager [phone]

## 2016-12-09 NOTE — Progress Notes (Signed)
Patient c/o 10/10 h/a. Patient administered ibuprophen for h/a and oxy IR for 8/10 back pain. Patient asked about IV dilaudid. RN educated patient on rebound h/a and reminded patient of h/a relief the morning of 12/09/16 when patient had not had IV dilaudid for over 12 hours. Patient agreed to try oral medication for relif. MD paged. RN will continue to monitor patient.

## 2016-12-09 NOTE — Progress Notes (Signed)
Patient requested and received SMOG enema-he emptied his bowels

## 2016-12-10 DIAGNOSIS — G062 Extradural and subdural abscess, unspecified: Secondary | ICD-10-CM

## 2016-12-10 LAB — BASIC METABOLIC PANEL
Anion gap: 10 (ref 5–15)
BUN: 11 mg/dL (ref 6–20)
CALCIUM: 9.1 mg/dL (ref 8.9–10.3)
CO2: 30 mmol/L (ref 22–32)
Chloride: 95 mmol/L — ABNORMAL LOW (ref 101–111)
Creatinine, Ser: 0.87 mg/dL (ref 0.61–1.24)
GFR calc Af Amer: >60 mL/min (ref >60)
GLUCOSE: 101 mg/dL — AB (ref 65–99)
Potassium: 4.7 mmol/L (ref 3.5–5.1)
Sodium: 135 mmol/L (ref 135–145)

## 2016-12-10 LAB — CBC
HCT: 34.2 % — ABNORMAL LOW (ref 39.0–52.0)
HEMOGLOBIN: 11.4 g/dL — AB (ref 13.0–17.0)
MCH: 29.8 pg (ref 26.0–34.0)
MCHC: 33.3 g/dL (ref 30.0–36.0)
MCV: 89.3 fL (ref 78.0–100.0)
PLATELETS: 578 10*3/uL — AB (ref 150–400)
RBC: 3.83 MIL/uL — AB (ref 4.22–5.81)
RDW: 14.5 % (ref 11.5–15.5)
WBC: 17.9 10*3/uL — AB (ref 4.0–10.5)

## 2016-12-10 LAB — HEPATITIS C GENOTYPE

## 2016-12-10 MED ORDER — CEPHALEXIN 500 MG PO CAPS: 500.0000 mg | ORAL_CAPSULE | Freq: Four times a day (QID) | ORAL | 0 refills | Status: AC

## 2016-12-10 MED ORDER — CEPHALEXIN 500 MG PO CAPS: 500.0000 mg | ORAL_CAPSULE | Freq: Four times a day (QID) | ORAL | 0 refills | Status: DC

## 2016-12-10 MED ORDER — DIAZEPAM 10 MG PO TABS: 10.0000 mg | ORAL_TABLET | Freq: Four times a day (QID) | ORAL | 0 refills | Status: DC | PRN

## 2016-12-10 MED ORDER — HYDROMORPHONE HCL 4 MG PO TABS: 2.0000 mg | ORAL_TABLET | Freq: Four times a day (QID) | ORAL | 0 refills | Status: DC | PRN

## 2016-12-10 NOTE — Discharge Instructions (Signed)
[facility]  Please get a complete blood count and chemistry panel checked by your Primary MD at your next visit, and again as instructed by your Primary MD. Please get your medications reviewed and adjusted by your Primary MD.  Please request your Primary MD to go over all Hospital Tests and Procedure/Radiological results at the follow up, please get all Hospital records sent to your Prim MD by signing hospital release before you go home.  If you had Pneumonia of Lung problems at the Hospital: Please get a 2 view Chest X ray done in 6-8 weeks after hospital discharge or sooner if instructed by your Primary MD.  If you have Congestive Heart Failure: Please call your Cardiologist or Primary MD anytime you have any of the following symptoms:  1) 3 pound weight gain in 24 hours or 5 pounds in 1 week  2) shortness of breath, with or without a dry hacking cough  3) swelling in the hands, feet or stomach  4) if you have to sleep on extra pillows at night in order to breathe  Follow cardiac low salt diet and 1.5 lit/day fluid restriction.  If you have diabetes Accuchecks 4 times/day, Once in AM empty stomach and then before each meal. Log in all results and show them to your primary doctor at your next visit. If any glucose reading is under 80 or above 300 call your primary MD immediately.  If you have Seizure/Convulsions/Epilepsy: Please do not drive, operate heavy machinery, participate in activities at heights or participate in high speed sports until you have seen by Primary MD or a Neurologist and advised to do so again.  If you had Gastrointestinal Bleeding: Please ask your Primary MD to check a complete blood count within one week of discharge or at your next visit. Your endoscopic/colonoscopic biopsies that are pending at the time of discharge, will also need to followed by your Primary MD.  Get Medicines reviewed and adjusted. Please take  all your medications with you for your next visit with your Primary MD  Please request your Primary MD to go over all hospital tests and procedure/radiological results at the follow up, please ask your Primary MD to get all Hospital records sent to his/her office.  If you experience worsening of your admission symptoms, develop shortness of breath, life threatening emergency, suicidal or homicidal thoughts you must seek medical attention immediately by calling 911 or calling your MD immediately  if symptoms less severe.  You must read complete instructions/literature along with all the possible adverse reactions/side effects for all the Medicines you take and that have been prescribed to you. Take any new Medicines after you have completely understood and accpet all the possible adverse reactions/side effects.   Do not drive or operate heavy machinery when taking Pain medications.   Do not take more than prescribed Pain, Sleep and Anxiety Medications  Special Instructions: If you have smoked or chewed Tobacco  in the last 2 yrs please stop smoking, stop any regular Alcohol  and or any Recreational drug use.  Wear Seat belts while driving.  Please note You were cared for by a hospitalist during your hospital stay. If you have any questions about your discharge medications or the care you received while you were in the hospital after you are discharged, you can call the unit and asked to speak with the hospitalist on call if the hospitalist that took care of you is not  available. Once you are discharged, your primary care physician will handle any further medical issues. Please note that NO REFILLS for any discharge medications will be authorized once you are discharged, as it is imperative that you return to your primary care physician (or establish a relationship with a primary care physician if you do not have one) for your aftercare needs so that they can reassess your need for medications and  monitor your lab values.  You can reach the hospitalist office at phone [phone] or fax [phone]   If you do not have a primary care physician, you can call [phone] for a physician referral.  Activity: As tolerated with Full fall precautions use walker/cane & assistance as needed  Diet: regular  Disposition Home

## 2016-12-10 NOTE — Care Management Note (Signed)
Case Management Note  Patient Details  Name: Larry HartCharles Z Best MRN: [id number] Date of Birth: 05/11/1986  Subjective/Objective:                    Action/Plan: Pt discharging home with his wife and family. [facility] Pts wife is going to call early next week and schedule him an appointment. Pt discharging on 3 medications, 2 which are narcotics. [facility] will not cover these. CM provided the patient with coupons for the medications. The family states they are able to afford these medications with the coupons. Family is going to provide transportation home.   Expected Discharge Date:                  Expected Discharge Plan:  Home/Self Care  In-House Referral:     Discharge planning Services  CM Consult  Post Acute Care Choice:    Choice offered to:     DME Arranged:    DME Agency:     HH Arranged:    HH Agency:     Status of Service:  Completed, signed off  If discussed at [employer] Length of Stay Meetings, dates discussed:    Additional Comments:  Kermit BaloKelli F Younes Degeorge, RN 12/10/2016, 12:08 PM

## 2016-12-10 NOTE — Progress Notes (Signed)
D/C INSTRUCTIONS REVIEWED WITH PATIENT, GIRLFRIEND AND MOTHER. EMPHASIS PLACED ON TAKING MEDICATION AS ORDERED, AND COMPLETING THE ANTIBIOTIC TREATMENT AS ORDERED.

## 2016-12-10 NOTE — Progress Notes (Signed)
Patient administered 1 tab Fioricet PO at 2124 on 12/09/16 that resolved h/a, patient states 0/10. Patient encouraged to try to manage pain with oral medication today. Patient reminded the last 2 nights he was able get back pain to minimal pain and keep headache gone when no IV pain medications were administered. Patient v/u. Patient states he is ready to go home and feels he will be able to manage pain with prescribed pain medication. Patient is concerned migraine or severe h/a may return. RN will continue to monitor.

## 2016-12-10 NOTE — Discharge Summary (Signed)
Physician Discharge Summary  JAKOBI THETFORD ZOX:[id number] DOB: [birth year] DOA: 12/03/2016  PCP: [facility]  Admit date: 12/03/2016 Discharge date: 12/10/2016  Admitted From: home Disposition:  home  Recommendations for Outpatient Follow-up:  1. Follow up with Neurosurgery in 1 week 2. Patient is to establish care and [facility] 3. Follow up with infectious disease in 4 weeks  Home Health: None Equipment/Devices: None  Discharge Condition: Stable CODE STATUS: Full code Diet recommendation: Regular  HPI: per Junious Silk, NP Luisa Hart is a 30 y.o. male without any significant medical history. Patient experienced a mechanical fall in the shower and was evaluated in the ER on 1217 with normal exam including normal plain x-rays. Symptoms were treated with narcotic analgesics and nonsteroidal anti-inflammatory drugs. Unfortunately pain persisted and he presented to his primary care physician. He was having leg weakness without true neurological deficit but due to the intensity of his symptoms and MRI was ordered. This revealed extensive edema in the posterior paraspinous muscles with focal fluid collections most worrisome for pyomyositis. There was also an epidural fluid collection most consistent with epidural abscess with marked compression of the thecal sac from L4-5 into the sacral segments. EDP contacted neurosurgery who reviewed the films and examined the patient and determined the patient will need urgent surgical intervention tonight. Neurosurgery has requested internal medicine admission.  Hospital Course: Discharge Diagnoses:  Principal Problem:   Epidural abscess Active Problems:   Abscess in epidural space of lumbar spine   IVDU (intravenous drug user)   Staphylococcus aureus bacteremia   MSSA (methicillin susceptible Staphylococcus aureus) infection   Headache   Pyomyositis   MSSA  bacteremia, with epidural abscess, vertebral osteomyelitis - Infectious diseases consultant following, appreciate input, and followed patient while hospitalized. His blood cultures as well as cultures from the surgical wound grew MSSA for which she was maintained on Ancef while hospitalized. He eventually underwent a TEE which was negative for vegetations. He is not a candidate for PICC line due to history of IV drug use, infectious disease MD recommending changing to Keflex 2 g daily. Neurosurgery was also consulted, and patient is status post bilateral and complete laminectomies of L4, L5, and S1 with medial facetectomies at L4-5 L5-S1 and foraminotomies of the L4, L5, S1, and S2 nerve roots on 12/22. Repeat blood cultures are negative to date, and most recent set of negative blood cultures are dated 12/25. He will have to follow up with neurosurgery in office and ID Mild normocytic anemia - Stable Headache - Resolved H/o IVDU: - Hepatitis panel shows hepatitis C antibodies. HCV RNA negative. HIV antibody is negative.  Leukocytosis - still elevated, improving Fever - fever curve improved  Discharge Instructions   Allergies as of 12/10/2016   No Known Allergies     Medication List    STOP taking these medications   cyclobenzaprine 10 MG tablet Commonly known as:  FLEXERIL   HYDROcodone-acetaminophen 5-325 MG tablet Commonly known as:  NORCO/VICODIN     TAKE these medications   cephALEXin 500 MG capsule Commonly known as:  KEFLEX Take 1 capsule (500 mg total) by mouth 4 (four) times daily.   diazepam 10 MG tablet Commonly known as:  VALIUM Take 1 tablet (10 mg total) by mouth every 6 (six) hours as needed.   HYDROmorphone 4 MG tablet Commonly known as:  DILAUDID Take 0.5-1 tablets (2-4 mg total) by mouth every 6 (six) hours as needed  for severe pain.   ibuprofen 800 MG tablet Commonly known as:  ADVIL,MOTRIN Take 1 tablet (800 mg total) by mouth 3 (three) times daily.       Follow-up Information    CRAM,GARY P, MD. Schedule an appointment as soon as possible for a visit in 1 week(s).   Specialty:  Neurosurgery Contact information: [address] [place] [address] [phone]        [facility]. Call in 1 day(s).   Contact information: [address] [place] [phone]         No Known Allergies  Consultations:  ID  Neurosurgery   Procedures/Studies:  2D echo  TEE  bilateral and complete laminectomies of L4, L5, and S1 with medial facetectomies at L4-5 L5-S1 and foraminotomies of the L4, L5, S1, and S2 nerve roots on 12/22  Dg Lumbar Spine Complete  Result Date: 11/28/2016 CLINICAL DATA:  Fall 2 days ago, low back pain, left leg pain EXAM: LUMBAR SPINE - COMPLETE 4+ VIEW COMPARISON:  None. FINDINGS: Five views of the lumbar spine submitted. No acute fracture or subluxation. Alignment and vertebral body heights are preserved. Mild disc space flattening at L5-S1 level. There is mild disc space flattening with anterior spurring at T11-T12 level. IMPRESSION: No acute fracture or subluxation. Mild disc space flattening at L5-S1 level. Mild disc space flattening with anterior spurring at T11-T12 level. Electronically Signed   By: Natasha MeadLiviu  Pop M.D.   On: 11/28/2016 10:56   Mr Lumbar Spine Wo Contrast  Result Date: 12/03/2016 CLINICAL DATA:  Severe low back and bilateral leg pain since the patient fell in the bathtub 11/27/2016. EXAM: MRI LUMBAR SPINE WITHOUT CONTRAST TECHNIQUE: Multiplanar, multisequence MR imaging of the lumbar spine was performed. No intravenous contrast was administered. COMPARISON:  Plain films lumbar spine 11/28/2016. FINDINGS: Segmentation:  Standard. Alignment:  Maintained. Vertebrae: No fracture. There is some marrow edema about the left and right L4-5 facet joints. Conus medullaris: Extends to the L1 level and appears normal. Paraspinal  and other soft tissues: Extensive edema is seen in paraspinous musculature from the L1-2 level into the sacral segments. Edematous change is worst from mid L3 to S1-2. Focal fluid collections in paraspinous musculature are seen. On the left, the collection is centered at the level of the L5-S1 facet joints and measures 1.4 cm transverse by 2.1 cm by 5 cm craniocaudal. Collection on the right is centered at the level of the L5 pedicles measuring 1.6 cm transverse by 1.7 cm AP by 3.5 cm craniocaudal. Edema is also seen in the posterior aspect of the left psoas muscle. Disc levels: A posterior epidural collection is seen extending from approximately mid L4 to approximately S2. The collection measures up to 1.1 cm transverse by 0.9 cm AP by 7.8 cm craniocaudal. The collection causes marked compression of the thecal sac appearing worst from at L4-5 to the S1-2 level. The entire extent of the collection is not imaged in the axial plane. Intervertebral discs appear normal from T12-L1 to L4-5. Broad-based central protrusion at L5-S1 is identified. The foramina are open at this level. IMPRESSION: Extensive edema posterior paraspinous muscles with focal fluid collections most worrisome for pyomyositis. Epidural fluid collection as described above is most consistent with epidural abscess and causes marked compression of the thecal sac from L4-5 into the sacral segments. Given history of fall, hematoma is possible but highly unlikely. Marrow edema in the L4-5 facet joints worrisome for  osteomyelitis. Broad-based central protrusion L5-S1 contributes to central canal stenosis in conjunction with epidural fluid. Critical Value/emergent results were called by telephone at the time of interpretation on 12/03/2016 at 1:42 pm to Dr. Deatra James , who verbally acknowledged these results. Electronically Signed   By: Drusilla Kanner M.D.   On: 12/03/2016 13:46   Dg Lumbar Spine 1 View  Result Date: 12/03/2016 CLINICAL DATA:  Lumbar  laminectomy for epidural abscess L4-S1 EXAM: LUMBAR SPINE - 1 VIEW COMPARISON:  Cross-table lateral portable intraoperative image 190 7 hours compared to MRI lumbar spine 12/03/2016. FINDINGS: MR labeled with 5 lumbar vertebra. Metallic probe via dorsal approach projects dorsal to the mid L5 level. Tissue spreader and sponge project dorsal to the spine at the L4-S1 levels. Vertebral body and disc space heights maintained. IMPRESSION: Dorsal localization of the mid L5 level. Electronically Signed   By: Ulyses Southward M.D.   On: 12/03/2016 20:39      Subjective: - no chest pain, shortness of breath, no abdominal pain, nausea or vomiting. Moderate back pain  Discharge Exam: Vitals:   12/10/16 0537 12/10/16 0957  BP: 115/69 (!) 106/52  Pulse: 91 94  Resp: 20 20  Temp: 98 F (36.7 C) 98.6 F (37 C)   Vitals:   12/10/16 0053 12/10/16 0427 12/10/16 0537 12/10/16 0957  BP: 126/73  115/69 (!) 106/52  Pulse: (!) 117  91 94  Resp: 20  20 20   Temp: 98.5 F (36.9 C)  98 F (36.7 C) 98.6 F (37 C)  TempSrc: Oral  Oral   SpO2: 99%  98% 98%  Weight:  97 kg (213 lb 14.4 oz)    Height:        General: Pt is alert, awake, not in acute distress Cardiovascular: RRR, S1/S2 +, no rubs, no gallops Respiratory: CTA bilaterally, no wheezing, no rhonchi Abdominal: Soft, NT, ND, bowel sounds +    The results of significant diagnostics from this hospitalization (including imaging, microbiology, ancillary and laboratory) are listed below for reference.     Microbiology: Recent Results (from the past 240 hour(s))  Blood culture (routine x 2)     Status: Abnormal   Collection Time: 12/03/16  3:48 PM  Result Value Ref Range Status   Specimen Description BLOOD RIGHT ANTECUBITAL  Final   Special Requests BOTTLES DRAWN AEROBIC AND ANAEROBIC 5CC  Final   Culture  Setup Time   Final    GRAM POSITIVE COCCI IN CLUSTERS IN BOTH AEROBIC AND ANAEROBIC BOTTLES CRITICAL VALUE NOTED.  VALUE IS CONSISTENT WITH  PREVIOUSLY REPORTED AND CALLED VALUE.    Culture (A)  Final    STAPHYLOCOCCUS AUREUS SUSCEPTIBILITIES PERFORMED ON PREVIOUS CULTURE WITHIN THE LAST 5 DAYS.    Report Status 12/06/2016 FINAL  Final  Blood culture (routine x 2)     Status: Abnormal   Collection Time: 12/03/16  3:56 PM  Result Value Ref Range Status   Specimen Description BLOOD RIGHT HAND  Final   Special Requests BOTTLES DRAWN AEROBIC AND ANAEROBIC 5CC  Final   Culture  Setup Time   Final    GRAM POSITIVE COCCI IN CLUSTERS IN BOTH AEROBIC AND ANAEROBIC BOTTLES CRITICAL RESULT CALLED TO, READ BACK BY AND VERIFIED WITH: TONYA JOHNSON,PHARMD @0724  12/04/16 MKELLY,MLT    Culture STAPHYLOCOCCUS AUREUS (A)  Final   Report Status 12/06/2016 FINAL  Final   Organism ID, Bacteria STAPHYLOCOCCUS AUREUS  Final      Susceptibility   Staphylococcus aureus - MIC*  CIPROFLOXACIN <=0.5 SENSITIVE Sensitive     ERYTHROMYCIN <=0.25 SENSITIVE Sensitive     GENTAMICIN <=0.5 SENSITIVE Sensitive     OXACILLIN <=0.25 SENSITIVE Sensitive     TETRACYCLINE <=1 SENSITIVE Sensitive     VANCOMYCIN <=0.5 SENSITIVE Sensitive     TRIMETH/SULFA <=10 SENSITIVE Sensitive     CLINDAMYCIN <=0.25 SENSITIVE Sensitive     RIFAMPIN <=0.5 SENSITIVE Sensitive     Inducible Clindamycin NEGATIVE Sensitive     * STAPHYLOCOCCUS AUREUS  Blood Culture ID Panel (Reflexed)     Status: Abnormal   Collection Time: 12/03/16  3:56 PM  Result Value Ref Range Status   Enterococcus species NOT DETECTED NOT DETECTED Final   Listeria monocytogenes NOT DETECTED NOT DETECTED Final   Staphylococcus species DETECTED (A) NOT DETECTED Final    Comment: CRITICAL RESULT CALLED TO, READ BACK BY AND VERIFIED WITH: Renard HamperONYA JOHNSON, PHARMD @0724  12/04/16 MKELLY,MLT    Staphylococcus aureus DETECTED (A) NOT DETECTED Final    Comment: CRITICAL RESULT CALLED TO, READ BACK BY AND VERIFIED WITH: Renard HamperONYA JOHNSON, PHARMD @0724  12/04/16 MKELLY,MLT    Methicillin resistance NOT DETECTED  NOT DETECTED Final   Streptococcus species NOT DETECTED NOT DETECTED Final   Streptococcus agalactiae NOT DETECTED NOT DETECTED Final   Streptococcus pneumoniae NOT DETECTED NOT DETECTED Final   Streptococcus pyogenes NOT DETECTED NOT DETECTED Final   Acinetobacter baumannii NOT DETECTED NOT DETECTED Final   Enterobacteriaceae species NOT DETECTED NOT DETECTED Final   Enterobacter cloacae complex NOT DETECTED NOT DETECTED Final   Escherichia coli NOT DETECTED NOT DETECTED Final   Klebsiella oxytoca NOT DETECTED NOT DETECTED Final   Klebsiella pneumoniae NOT DETECTED NOT DETECTED Final   Proteus species NOT DETECTED NOT DETECTED Final   Serratia marcescens NOT DETECTED NOT DETECTED Final   Haemophilus influenzae NOT DETECTED NOT DETECTED Final   Neisseria meningitidis NOT DETECTED NOT DETECTED Final   Pseudomonas aeruginosa NOT DETECTED NOT DETECTED Final   Candida albicans NOT DETECTED NOT DETECTED Final   Candida glabrata NOT DETECTED NOT DETECTED Final   Candida krusei NOT DETECTED NOT DETECTED Final   Candida parapsilosis NOT DETECTED NOT DETECTED Final   Candida tropicalis NOT DETECTED NOT DETECTED Final  Aerobic/Anaerobic Culture (surgical/deep wound)     Status: None   Collection Time: 12/03/16  7:14 PM  Result Value Ref Range Status   Specimen Description WOUND  Final   Special Requests SWAB OF LEFT PARASPINAL PT ON ANCEF VANC SAMPLE A  Final   Gram Stain   Final    RARE WBC PRESENT,BOTH PMN AND MONONUCLEAR FEW GRAM POSITIVE COCCI IN CLUSTERS    Culture   Final    ABUNDANT STAPHYLOCOCCUS AUREUS NO ANAEROBES ISOLATED    Report Status 12/08/2016 FINAL  Final   Organism ID, Bacteria STAPHYLOCOCCUS AUREUS  Final      Susceptibility   Staphylococcus aureus - MIC*    CIPROFLOXACIN <=0.5 SENSITIVE Sensitive     ERYTHROMYCIN 0.5 SENSITIVE Sensitive     GENTAMICIN <=0.5 SENSITIVE Sensitive     OXACILLIN <=0.25 SENSITIVE Sensitive     TETRACYCLINE <=1 SENSITIVE Sensitive      VANCOMYCIN <=0.5 SENSITIVE Sensitive     TRIMETH/SULFA <=10 SENSITIVE Sensitive     CLINDAMYCIN <=0.25 SENSITIVE Sensitive     RIFAMPIN <=0.5 SENSITIVE Sensitive     Inducible Clindamycin NEGATIVE Sensitive     * ABUNDANT STAPHYLOCOCCUS AUREUS  Aerobic/Anaerobic Culture (surgical/deep wound)     Status: None   Collection Time: 12/03/16  7:16  PM  Result Value Ref Range Status   Specimen Description WOUND  Final   Special Requests LEFT PARASPINAL SWAB PT ON VANC ANCEF SAMPLE B  Final   Gram Stain   Final    MODERATE WBC PRESENT,BOTH PMN AND MONONUCLEAR MODERATE GRAM POSITIVE COCCI IN CLUSTERS    Culture   Final    ABUNDANT STAPHYLOCOCCUS AUREUS SUSCEPTIBILITIES PERFORMED ON PREVIOUS CULTURE WITHIN THE LAST 5 DAYS. NO ANAEROBES ISOLATED    Report Status 12/08/2016 FINAL  Final  Aerobic/Anaerobic Culture (surgical/deep wound)     Status: None   Collection Time: 12/03/16  7:21 PM  Result Value Ref Range Status   Specimen Description WOUND  Final   Special Requests LEFT LUMBAR FACET 4,5 PT ON VANC ANCEF SAMPLE C  Final   Gram Stain   Final    FEW WBC PRESENT, PREDOMINANTLY PMN RARE GRAM POSITIVE COCCI IN PAIRS    Culture   Final    FEW STAPHYLOCOCCUS AUREUS SUSCEPTIBILITIES PERFORMED ON PREVIOUS CULTURE WITHIN THE LAST 5 DAYS. NO ANAEROBES ISOLATED    Report Status 12/08/2016 FINAL  Final  Aerobic/Anaerobic Culture (surgical/deep wound)     Status: None   Collection Time: 12/03/16  7:28 PM  Result Value Ref Range Status   Specimen Description WOUND  Final   Special Requests   Final    LEFT LUMBAR FOUR EPIDURAL SPACE PT ON VANC ANCEF SAMPLE D   Gram Stain   Final    RARE WBC PRESENT,BOTH PMN AND MONONUCLEAR RARE GRAM POSITIVE COCCI IN CLUSTERS    Culture   Final    ABUNDANT STAPHYLOCOCCUS AUREUS SUSCEPTIBILITIES PERFORMED ON PREVIOUS CULTURE WITHIN THE LAST 5 DAYS. NO ANAEROBES ISOLATED    Report Status 12/08/2016 FINAL  Final  Culture, blood (Routine X 2) w Reflex to ID  Panel     Status: Abnormal (Preliminary result)   Collection Time: 12/04/16  2:10 PM  Result Value Ref Range Status   Specimen Description BLOOD LEFT ARM  Final   Special Requests BOTTLES DRAWN AEROBIC AND ANAEROBIC 5CC  Final   Culture  Setup Time   Final    GRAM POSITIVE COCCI IN CLUSTERS ANAEROBIC BOTTLE ONLY CRITICAL RESULT CALLED TO, READ BACK BY AND VERIFIED WITH: A JOHNSTON,PHARMD AT 1211 12/10/16 BY L Snee CORRECTED RESULTS PREVIOUSLY REPORTED AS NO GROWTH IN 5 DAYS BOTTLE BECAME POSITIVE AFTER CULTURE WAS FINALIZED    Culture (A)  Final    STAPHYLOCOCCUS AUREUS SUSCEPTIBILITIES PERFORMED ON PREVIOUS CULTURE WITHIN THE LAST 5 DAYS.    Report Status PENDING  Incomplete  Culture, blood (Routine X 2) w Reflex to ID Panel     Status: None   Collection Time: 12/04/16  2:20 PM  Result Value Ref Range Status   Specimen Description BLOOD LEFT HAND  Final   Special Requests BOTTLES DRAWN AEROBIC AND ANAEROBIC 5CC  Final   Culture NO GROWTH 5 DAYS  Final   Report Status 12/09/2016 FINAL  Final  Culture, blood (Routine X 2) w Reflex to ID Panel     Status: None (Preliminary result)   Collection Time: 12/06/16  3:25 PM  Result Value Ref Range Status   Specimen Description BLOOD LEFT ANTECUBITAL  Final   Special Requests BOTTLES DRAWN AEROBIC ONLY  Final   Culture NO GROWTH 4 DAYS  Final   Report Status PENDING  Incomplete  Culture, blood (Routine X 2) w Reflex to ID Panel     Status: None (Preliminary result)   Collection  Time: 12/06/16  3:29 PM  Result Value Ref Range Status   Specimen Description BLOOD LEFT ANTECUBITAL  Final   Special Requests BOTTLES DRAWN AEROBIC ONLY   Final   Culture NO GROWTH 4 DAYS  Final   Report Status PENDING  Incomplete     Labs: BNP (last 3 results) No results for input(s): BNP in the last 8760 hours. Basic Metabolic Panel:  Recent Labs Lab 12/04/16 0459 12/07/16 0743 12/10/16 0635  NA 137 137 135  K 3.7 4.4 4.7  CL 100* 99*  95*  CO2 26 28 30   GLUCOSE 100* 117* 101*  BUN 5* 6 11  CREATININE 0.86 0.83 0.87  CALCIUM 8.1* 8.5* 9.1   Liver Function Tests:  Recent Labs Lab 12/04/16 0459  AST 23  ALT 33  ALKPHOS 143*  BILITOT 0.5  PROT 5.7*  ALBUMIN 2.2*   No results for input(s): LIPASE, AMYLASE in the last 168 hours. No results for input(s): AMMONIA in the last 168 hours. CBC:  Recent Labs Lab 12/04/16 0459 12/07/16 0743 12/10/16 0635  WBC 17.9* 19.6* 17.9*  NEUTROABS 12.7*  --   --   HGB 11.3* 10.6* 11.4*  HCT 33.9* 31.7* 34.2*  MCV 88.7 88.3 89.3  PLT 228 330 578*   Cardiac Enzymes: No results for input(s): CKTOTAL, CKMB, CKMBINDEX, TROPONINI in the last 168 hours. BNP: Invalid input(s): POCBNP CBG: No results for input(s): GLUCAP in the last 168 hours. D-Dimer No results for input(s): DDIMER in the last 72 hours. Hgb A1c No results for input(s): HGBA1C in the last 72 hours. Lipid Profile No results for input(s): CHOL, HDL, LDLCALC, TRIG, CHOLHDL, LDLDIRECT in the last 72 hours. Thyroid function studies No results for input(s): TSH, T4TOTAL, T3FREE, THYROIDAB in the last 72 hours.  Invalid input(s): FREET3 Anemia work up No results for input(s): VITAMINB12, FOLATE, FERRITIN, TIBC, IRON, RETICCTPCT in the last 72 hours. Urinalysis    Component Value Date/Time   COLORURINE YELLOW 03/21/2014 1109   APPEARANCEUR CLEAR 03/21/2014 1109   LABSPEC 1.022 03/21/2014 1109   PHURINE 7.0 03/21/2014 1109   GLUCOSEU NEGATIVE 03/21/2014 1109   HGBUR NEGATIVE 03/21/2014 1109   BILIRUBINUR NEGATIVE 03/21/2014 1109   KETONESUR NEGATIVE 03/21/2014 1109   PROTEINUR NEGATIVE 03/21/2014 1109   UROBILINOGEN 0.2 03/21/2014 1109   NITRITE NEGATIVE 03/21/2014 1109   LEUKOCYTESUR NEGATIVE 03/21/2014 1109   Sepsis Labs Invalid input(s): PROCALCITONIN,  WBC,  LACTICIDVEN Microbiology Recent Results (from the past 240 hour(s))  Blood culture (routine x 2)     Status: Abnormal   Collection Time:  12/03/16  3:48 PM  Result Value Ref Range Status   Specimen Description BLOOD RIGHT ANTECUBITAL  Final   Special Requests BOTTLES DRAWN AEROBIC AND ANAEROBIC 5CC  Final   Culture  Setup Time   Final    GRAM POSITIVE COCCI IN CLUSTERS IN BOTH AEROBIC AND ANAEROBIC BOTTLES CRITICAL VALUE NOTED.  VALUE IS CONSISTENT WITH PREVIOUSLY REPORTED AND CALLED VALUE.    Culture (A)  Final    STAPHYLOCOCCUS AUREUS SUSCEPTIBILITIES PERFORMED ON PREVIOUS CULTURE WITHIN THE LAST 5 DAYS.    Report Status 12/06/2016 FINAL  Final  Blood culture (routine x 2)     Status: Abnormal   Collection Time: 12/03/16  3:56 PM  Result Value Ref Range Status   Specimen Description BLOOD RIGHT HAND  Final   Special Requests BOTTLES DRAWN AEROBIC AND ANAEROBIC 5CC  Final   Culture  Setup Time   Final  GRAM POSITIVE COCCI IN CLUSTERS IN BOTH AEROBIC AND ANAEROBIC BOTTLES CRITICAL RESULT CALLED TO, READ BACK BY AND VERIFIED WITH: TONYA JOHNSON,PHARMD @0724  12/04/16 MKELLY,MLT    Culture STAPHYLOCOCCUS AUREUS (A)  Final   Report Status 12/06/2016 FINAL  Final   Organism ID, Bacteria STAPHYLOCOCCUS AUREUS  Final      Susceptibility   Staphylococcus aureus - MIC*    CIPROFLOXACIN <=0.5 SENSITIVE Sensitive     ERYTHROMYCIN <=0.25 SENSITIVE Sensitive     GENTAMICIN <=0.5 SENSITIVE Sensitive     OXACILLIN <=0.25 SENSITIVE Sensitive     TETRACYCLINE <=1 SENSITIVE Sensitive     VANCOMYCIN <=0.5 SENSITIVE Sensitive     TRIMETH/SULFA <=10 SENSITIVE Sensitive     CLINDAMYCIN <=0.25 SENSITIVE Sensitive     RIFAMPIN <=0.5 SENSITIVE Sensitive     Inducible Clindamycin NEGATIVE Sensitive     * STAPHYLOCOCCUS AUREUS  Blood Culture ID Panel (Reflexed)     Status: Abnormal   Collection Time: 12/03/16  3:56 PM  Result Value Ref Range Status   Enterococcus species NOT DETECTED NOT DETECTED Final   Listeria monocytogenes NOT DETECTED NOT DETECTED Final   Staphylococcus species DETECTED (A) NOT DETECTED Final    Comment:  CRITICAL RESULT CALLED TO, READ BACK BY AND VERIFIED WITH: Renard Hamper, PHARMD @0724  12/04/16 MKELLY,MLT    Staphylococcus aureus DETECTED (A) NOT DETECTED Final    Comment: CRITICAL RESULT CALLED TO, READ BACK BY AND VERIFIED WITH: Renard Hamper, PHARMD @0724  12/04/16 MKELLY,MLT    Methicillin resistance NOT DETECTED NOT DETECTED Final   Streptococcus species NOT DETECTED NOT DETECTED Final   Streptococcus agalactiae NOT DETECTED NOT DETECTED Final   Streptococcus pneumoniae NOT DETECTED NOT DETECTED Final   Streptococcus pyogenes NOT DETECTED NOT DETECTED Final   Acinetobacter baumannii NOT DETECTED NOT DETECTED Final   Enterobacteriaceae species NOT DETECTED NOT DETECTED Final   Enterobacter cloacae complex NOT DETECTED NOT DETECTED Final   Escherichia coli NOT DETECTED NOT DETECTED Final   Klebsiella oxytoca NOT DETECTED NOT DETECTED Final   Klebsiella pneumoniae NOT DETECTED NOT DETECTED Final   Proteus species NOT DETECTED NOT DETECTED Final   Serratia marcescens NOT DETECTED NOT DETECTED Final   Haemophilus influenzae NOT DETECTED NOT DETECTED Final   Neisseria meningitidis NOT DETECTED NOT DETECTED Final   Pseudomonas aeruginosa NOT DETECTED NOT DETECTED Final   Candida albicans NOT DETECTED NOT DETECTED Final   Candida glabrata NOT DETECTED NOT DETECTED Final   Candida krusei NOT DETECTED NOT DETECTED Final   Candida parapsilosis NOT DETECTED NOT DETECTED Final   Candida tropicalis NOT DETECTED NOT DETECTED Final  Aerobic/Anaerobic Culture (surgical/deep wound)     Status: None   Collection Time: 12/03/16  7:14 PM  Result Value Ref Range Status   Specimen Description WOUND  Final   Special Requests SWAB OF LEFT PARASPINAL PT ON ANCEF VANC SAMPLE A  Final   Gram Stain   Final    RARE WBC PRESENT,BOTH PMN AND MONONUCLEAR FEW GRAM POSITIVE COCCI IN CLUSTERS    Culture   Final    ABUNDANT STAPHYLOCOCCUS AUREUS NO ANAEROBES ISOLATED    Report Status 12/08/2016 FINAL   Final   Organism ID, Bacteria STAPHYLOCOCCUS AUREUS  Final      Susceptibility   Staphylococcus aureus - MIC*    CIPROFLOXACIN <=0.5 SENSITIVE Sensitive     ERYTHROMYCIN 0.5 SENSITIVE Sensitive     GENTAMICIN <=0.5 SENSITIVE Sensitive     OXACILLIN <=0.25 SENSITIVE Sensitive     TETRACYCLINE <=1 SENSITIVE  Sensitive     VANCOMYCIN <=0.5 SENSITIVE Sensitive     TRIMETH/SULFA <=10 SENSITIVE Sensitive     CLINDAMYCIN <=0.25 SENSITIVE Sensitive     RIFAMPIN <=0.5 SENSITIVE Sensitive     Inducible Clindamycin NEGATIVE Sensitive     * ABUNDANT STAPHYLOCOCCUS AUREUS  Aerobic/Anaerobic Culture (surgical/deep wound)     Status: None   Collection Time: 12/03/16  7:16 PM  Result Value Ref Range Status   Specimen Description WOUND  Final   Special Requests LEFT PARASPINAL SWAB PT ON VANC ANCEF SAMPLE B  Final   Gram Stain   Final    MODERATE WBC PRESENT,BOTH PMN AND MONONUCLEAR MODERATE GRAM POSITIVE COCCI IN CLUSTERS    Culture   Final    ABUNDANT STAPHYLOCOCCUS AUREUS SUSCEPTIBILITIES PERFORMED ON PREVIOUS CULTURE WITHIN THE LAST 5 DAYS. NO ANAEROBES ISOLATED    Report Status 12/08/2016 FINAL  Final  Aerobic/Anaerobic Culture (surgical/deep wound)     Status: None   Collection Time: 12/03/16  7:21 PM  Result Value Ref Range Status   Specimen Description WOUND  Final   Special Requests LEFT LUMBAR FACET 4,5 PT ON VANC ANCEF SAMPLE C  Final   Gram Stain   Final    FEW WBC PRESENT, PREDOMINANTLY PMN RARE GRAM POSITIVE COCCI IN PAIRS    Culture   Final    FEW STAPHYLOCOCCUS AUREUS SUSCEPTIBILITIES PERFORMED ON PREVIOUS CULTURE WITHIN THE LAST 5 DAYS. NO ANAEROBES ISOLATED    Report Status 12/08/2016 FINAL  Final  Aerobic/Anaerobic Culture (surgical/deep wound)     Status: None   Collection Time: 12/03/16  7:28 PM  Result Value Ref Range Status   Specimen Description WOUND  Final   Special Requests   Final    LEFT LUMBAR FOUR EPIDURAL SPACE PT ON VANC ANCEF SAMPLE D   Gram Stain    Final    RARE WBC PRESENT,BOTH PMN AND MONONUCLEAR RARE GRAM POSITIVE COCCI IN CLUSTERS    Culture   Final    ABUNDANT STAPHYLOCOCCUS AUREUS SUSCEPTIBILITIES PERFORMED ON PREVIOUS CULTURE WITHIN THE LAST 5 DAYS. NO ANAEROBES ISOLATED    Report Status 12/08/2016 FINAL  Final  Culture, blood (Routine X 2) w Reflex to ID Panel     Status: Abnormal (Preliminary result)   Collection Time: 12/04/16  2:10 PM  Result Value Ref Range Status   Specimen Description BLOOD LEFT ARM  Final   Special Requests BOTTLES DRAWN AEROBIC AND ANAEROBIC 5CC  Final   Culture  Setup Time   Final    GRAM POSITIVE COCCI IN CLUSTERS ANAEROBIC BOTTLE ONLY CRITICAL RESULT CALLED TO, READ BACK BY AND VERIFIED WITH: A JOHNSTON,PHARMD AT 1211 12/10/16 BY L Cowman CORRECTED RESULTS PREVIOUSLY REPORTED AS NO GROWTH IN 5 DAYS BOTTLE BECAME POSITIVE AFTER CULTURE WAS FINALIZED    Culture (A)  Final    STAPHYLOCOCCUS AUREUS SUSCEPTIBILITIES PERFORMED ON PREVIOUS CULTURE WITHIN THE LAST 5 DAYS.    Report Status PENDING  Incomplete  Culture, blood (Routine X 2) w Reflex to ID Panel     Status: None   Collection Time: 12/04/16  2:20 PM  Result Value Ref Range Status   Specimen Description BLOOD LEFT HAND  Final   Special Requests BOTTLES DRAWN AEROBIC AND ANAEROBIC 5CC  Final   Culture NO GROWTH 5 DAYS  Final   Report Status 12/09/2016 FINAL  Final  Culture, blood (Routine X 2) w Reflex to ID Panel     Status: None (Preliminary result)   Collection Time: 12/06/16  3:25 PM  Result Value Ref Range Status   Specimen Description BLOOD LEFT ANTECUBITAL  Final   Special Requests BOTTLES DRAWN AEROBIC ONLY  Final   Culture NO GROWTH 4 DAYS  Final   Report Status PENDING  Incomplete  Culture, blood (Routine X 2) w Reflex to ID Panel     Status: None (Preliminary result)   Collection Time: 12/06/16  3:29 PM  Result Value Ref Range Status   Specimen Description BLOOD LEFT ANTECUBITAL  Final   Special Requests BOTTLES  DRAWN AEROBIC ONLY   Final   Culture NO GROWTH 4 DAYS  Final   Report Status PENDING  Incomplete     Time coordinating discharge: Over 30 minutes  SIGNED:  Pamella Pert, MD  [facility] 12/10/2016, 4:16 PM Pager [phone]  If 7PM-7AM, please contact night-coverage [email or web address] Password [email or web address]

## 2016-12-10 NOTE — Progress Notes (Signed)
Received call from microbiology stating that 12/23 blood cultures that had previously been reported as "no growth final" have now turned positive for presumptive MSSA in anaerobic bottle of 1/4 blood cultures.  Blood cultures from 12/25 remain negative. Currently patient is on Ancef 2 grams IV every 8 hours for MSSA bacteremia with epidural abscess with plans to be discharge home on Keflex 1000 mg BID for 60 days per ID.  Pollyann SamplesAndy Yessenia Maillet, PharmD, BCPS 12/10/2016, 12:30 PM Pager: [phone]

## 2016-12-11 LAB — CULTURE, BLOOD (ROUTINE X 2)
CULTURE: NO GROWTH
CULTURE: NO GROWTH

## 2016-12-22 ENCOUNTER — Encounter: Payer: Self-pay | Admitting: Family Medicine

## 2016-12-22 ENCOUNTER — Telehealth: Payer: Self-pay

## 2016-12-22 ENCOUNTER — Ambulatory Visit: Payer: Self-pay | Attending: Family Medicine | Admitting: Family Medicine

## 2016-12-22 VITALS — BP 148/83 | HR 111 | Temp 98.2°F | Ht 68.0 in | Wt 214.6 lb

## 2016-12-22 DIAGNOSIS — Z79899 Other long term (current) drug therapy: Secondary | ICD-10-CM | POA: Insufficient documentation

## 2016-12-22 DIAGNOSIS — R030 Elevated blood-pressure reading, without diagnosis of hypertension: Secondary | ICD-10-CM | POA: Insufficient documentation

## 2016-12-22 DIAGNOSIS — Z9889 Other specified postprocedural states: Secondary | ICD-10-CM | POA: Insufficient documentation

## 2016-12-22 DIAGNOSIS — M545 Low back pain: Secondary | ICD-10-CM | POA: Insufficient documentation

## 2016-12-22 DIAGNOSIS — L02818 Cutaneous abscess of other sites: Secondary | ICD-10-CM | POA: Insufficient documentation

## 2016-12-22 DIAGNOSIS — G061 Intraspinal abscess and granuloma: Secondary | ICD-10-CM

## 2016-12-22 DIAGNOSIS — Z8661 Personal history of infections of the central nervous system: Secondary | ICD-10-CM | POA: Insufficient documentation

## 2016-12-22 MED ORDER — ACETAMINOPHEN-CODEINE #3 300-30 MG PO TABS: 1.0000 | ORAL_TABLET | ORAL | 0 refills | Status: DC | PRN

## 2016-12-22 NOTE — Progress Notes (Signed)
Subjective:  Patient ID: Larry Best, male    DOB: 11/24/1986  Age: 31 y.o. MRN: [id number]  CC: Back Pain (lower back) and Constipation (5 BM's in 3 weeks)   HPI Larry Hartharles Z Zinger with a h/o IVDU who presents to establish care after hospitalization for epidural abscess status post emergency decompressive lumbar laminectomy of L4L5, S1with medial facetectomies at L4-5 L5-S1 and foraminotomies of the L4, L5, S1, and S2 nerve roots on 12/22 on 12/03/16. Wound cultures were positive for MSSA Transesophageal echocardiogram was negative. Seen by infectious disease during  Hospitalization and he was maintained on IV Ancef which was subsequently transitioned to Keflex at discharge.  Denies fever but does have pain in his tailbone. He does not have any follow-up  Appointment with neurosurgery or Infectious disease.    Past Medical History:  Diagnosis Date  . IVDU (intravenous drug user) 12/04/2016  . MSSA (methicillin susceptible Staphylococcus aureus) infection 12/04/2016  . Staphylococcus aureus bacteremia 12/04/2016    Past Surgical History:  Procedure Laterality Date  . LUMBAR LAMINECTOMY FOR EPIDURAL ABSCESS N/A 12/03/2016   Procedure: DECOMPRESSIVE LUMBAR LAMINECTOMY FOR EPIDURAL ABSCESS LUMBAR FOUR- SACRAL ONE;  Surgeon: Donalee CitrinGary Cram, MD;  [facility];  Service: Neurosurgery;  Laterality: N/A;  . TEE WITHOUT CARDIOVERSION N/A 12/09/2016   Procedure: TRANSESOPHAGEAL ECHOCARDIOGRAM (TEE);  Surgeon: Thurmon FairMihai Croitoru, MD;  [facility];  Service: Cardiovascular;  Laterality: N/A;    No Known Allergies   Outpatient Medications Prior to Visit  Medication Sig Dispense Refill  . cephALEXin (KEFLEX) 500 MG capsule Take 1 capsule (500 mg total) by mouth 4 (four) times daily. 240 capsule 0  . ibuprofen (ADVIL,MOTRIN) 800 MG tablet Take 1 tablet (800 mg total) by mouth 3 (three) times daily. 21 tablet 0  . diazepam (VALIUM) 10 MG tablet Take 1 tablet (10 mg total) by mouth  every 6 (six) hours as needed. (Patient not taking: Reported on 12/22/2016) 30 tablet 0  . HYDROmorphone (DILAUDID) 4 MG tablet Take 0.5-1 tablets (2-4 mg total) by mouth every 6 (six) hours as needed for severe pain. (Patient not taking: Reported on 12/22/2016) 30 tablet 0   No facility-administered medications prior to visit.     ROS Review of Systems  Constitutional: Negative for activity change and appetite change.  HENT: Negative for sinus pressure and sore throat.   Respiratory: Negative for chest tightness, shortness of breath and wheezing.   Cardiovascular: Negative for chest pain and palpitations.  Gastrointestinal: Negative for abdominal distention, abdominal pain and constipation.  Genitourinary: Negative.   Musculoskeletal: Positive for back pain.  Psychiatric/Behavioral: Negative for behavioral problems and dysphoric mood.    Objective:  BP (!) 148/83 (BP Location: Right Arm, Patient Position: Sitting, Cuff Size: Small)   Pulse (!) 111   Temp 98.2 F (36.8 C) (Oral)   Ht 5\' 8"  (1.727 m)   Wt 214 lb 9.6 oz (97.3 kg)   SpO2 99%   BMI 32.63 kg/m   BP/Weight 12/22/2016 12/10/2016 12/03/2016  Systolic BP 148 106 -  Diastolic BP 83 52 -  Wt. (Lbs) 214.6 213.9 -  BMI 32.63 - 31.59      Physical Exam  Constitutional: He is oriented to person, place, and time.  In acute pain  Cardiovascular: Normal heart sounds and intact distal pulses.  Tachycardia present.   No murmur heard. Pulmonary/Chest: Effort normal and breath sounds normal. He has no wheezes. He has no rales. He exhibits no tenderness.  Abdominal: Soft. Bowel sounds  are normal. He exhibits no distension and no mass. There is no tenderness.  Musculoskeletal: He exhibits tenderness (vertical surgical scar with surrounding edema and tenderness).  Neurological: He is alert and oriented to person, place, and time.     Assessment & Plan:   1. Abscess in epidural space of lumbar spine Continue Keflex -  acetaminophen-codeine (TYLENOL #3) 300-30 MG tablet; Take 1 tablet by mouth every 4 (four) hours as needed for moderate pain.  Dispense: 40 tablet; Refill: 0 - CBC with Differential/Platelet - Ambulatory referral to Neurosurgery Educated about applying for the [facility]  Financial assistance to facilitate referral process.  2. Elevated blood pressure reading Elevated blood pressure likely due to pain   Meds ordered this encounter  Medications  . acetaminophen-codeine (TYLENOL #3) 300-30 MG tablet    Sig: Take 1 tablet by mouth every 4 (four) hours as needed for moderate pain.    Dispense:  40 tablet    Refill:  0    Follow-up: Return in about 3 weeks (around 01/12/2017) for follow-up with epidural abscess.   Jaclyn Shaggy MD

## 2016-12-30 ENCOUNTER — Other Ambulatory Visit: Payer: Self-pay | Admitting: Neurosurgery

## 2016-12-30 DIAGNOSIS — G062 Extradural and subdural abscess, unspecified: Secondary | ICD-10-CM

## 2017-01-06 ENCOUNTER — Inpatient Hospital Stay
Admission: RE | Admit: 2017-01-06 | Discharge: 2017-01-06 | Disposition: A | Discharge status: Home / Self Care | Payer: Self-pay | Source: Ambulatory Visit | Attending: Neurosurgery | Admitting: Neurosurgery

## 2017-01-12 ENCOUNTER — Other Ambulatory Visit: Payer: Self-pay

## 2018-06-21 IMAGING — MR MR LUMBAR SPINE W/O CM
5 series · 45 of 48 positions shown · non-contrast
Comparison: Plain films lumbar spine 11/28/2016.

CLINICAL DATA: Severe low back and bilateral leg pain since the
patient fell in the bathtub 11/27/2016.

EXAM:
MRI LUMBAR SPINE WITHOUT CONTRAST
TECHNIQUE: Multiplanar, multisequence MR imaging of the lumbar spine was
performed. No intravenous contrast was administered.

[Series 3: T2 · sagittal · 4.0mm · 0.88mm/px · 5 of 13 slices shown (1 of 2)]
[im 1/13]
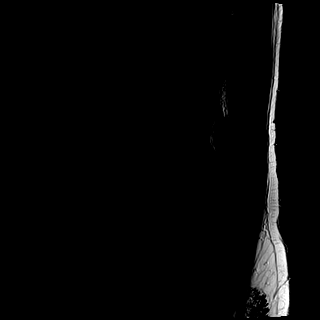
[im 4/13]
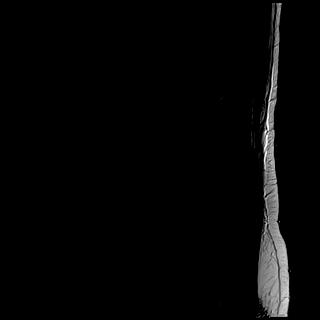
[im 7/13]
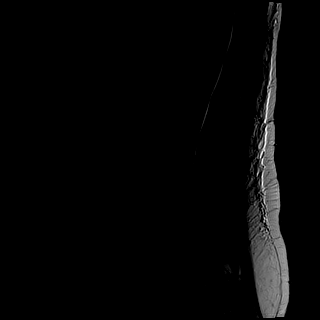
[im 10/13]
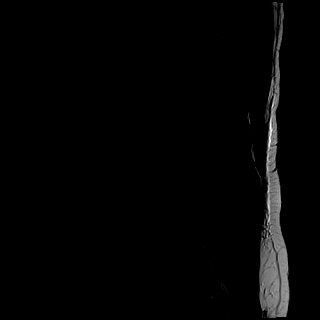
[im 13/13]
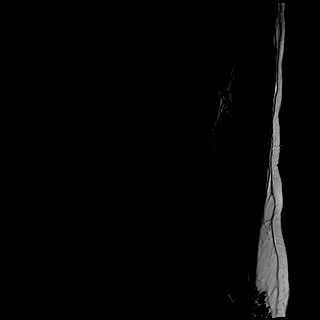

[Series 4: tirm sag · sagittal · 4.0mm · 0.55mm/px · 5 of 13 slices shown]
[im 1/13]
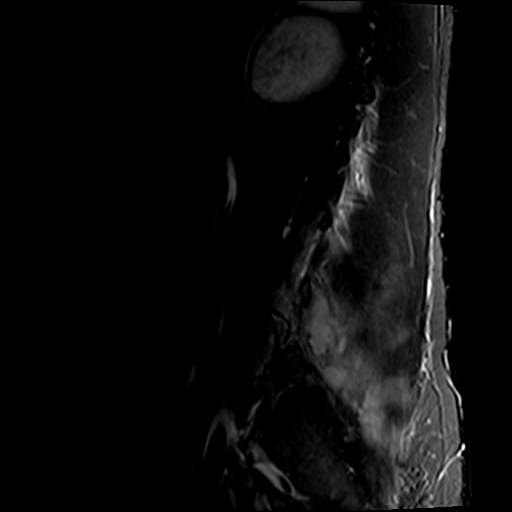
[im 4/13]
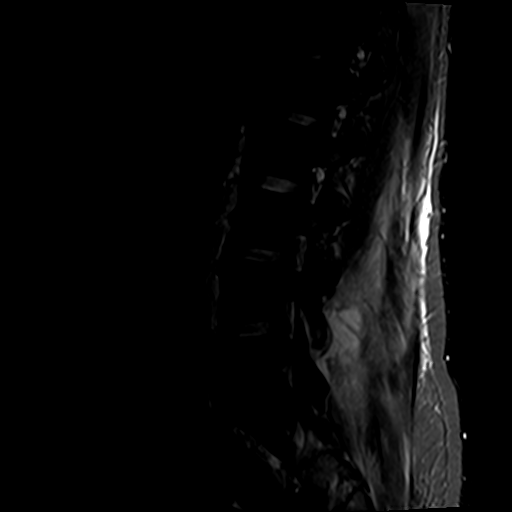
[im 7/13]
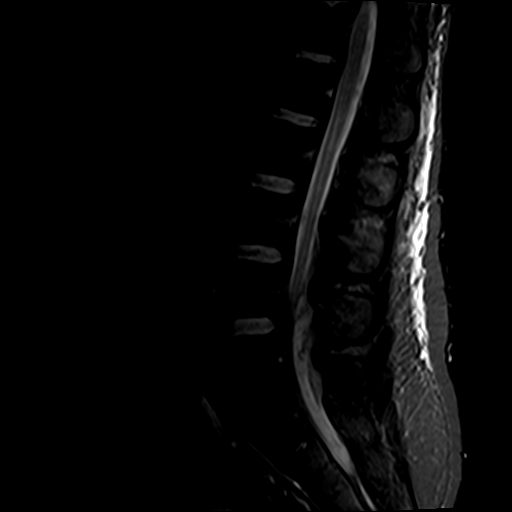
[im 10/13]
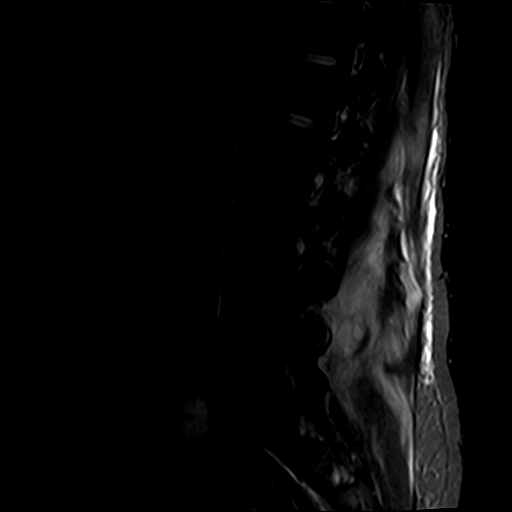
[im 13/13]
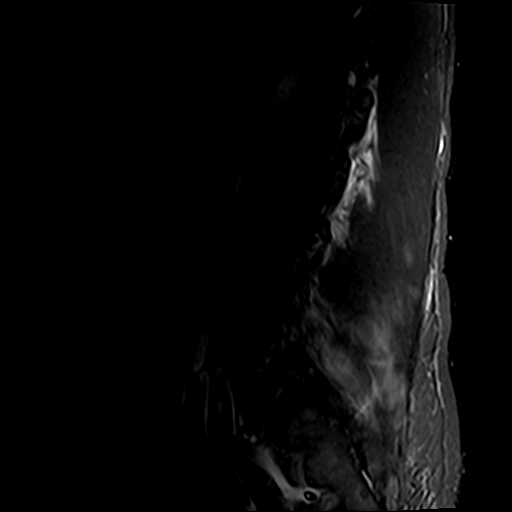

[Series 5: T1 · sagittal · 4.0mm · 0.88mm/px · 6 of 13 slices shown (1 of 2)]
[im 1/13]
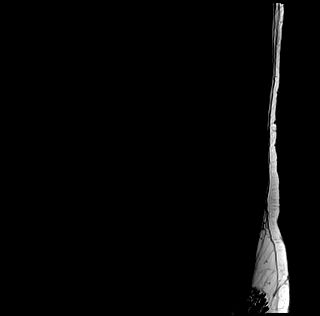
[im 3/13]
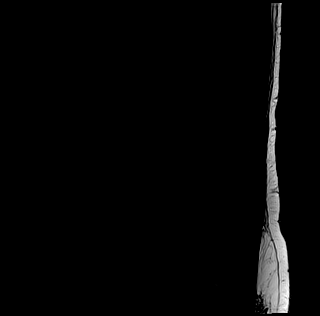
[im 5/13]
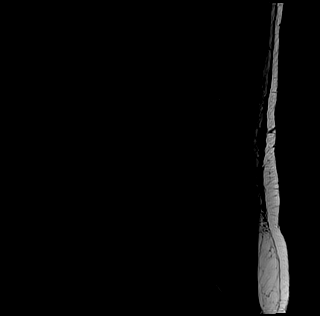
[im 8/13]
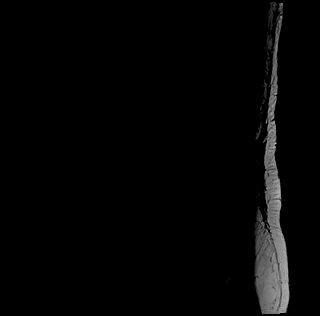
[im 10/13]
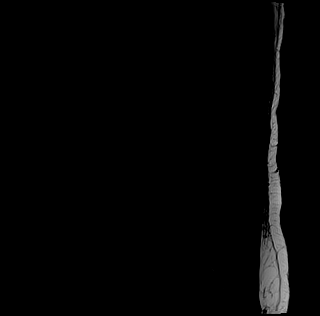
[im 13/13]
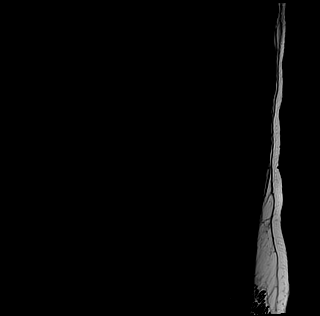

[Series 6: T1 · axial · 4.0mm · 0.78mm/px · z∈[-154,+49]mm · 13 of 36 slices shown (2 of 2)]
[im 1/36]
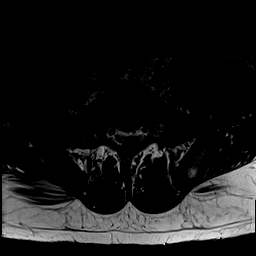
[im 3/36]
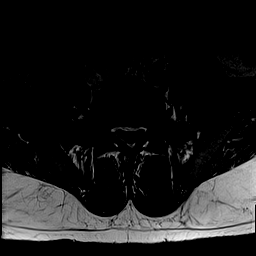
[im 5/36]
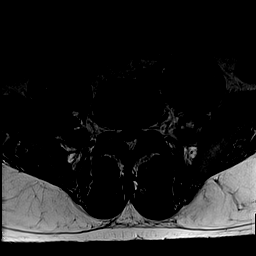
[im 8/36]
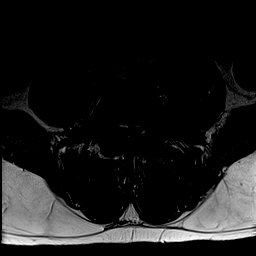
[im 10/36]
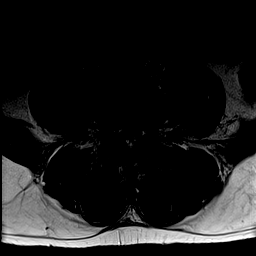
[im 12/36]
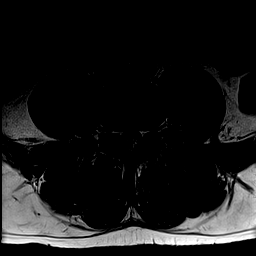
[im 15/36]
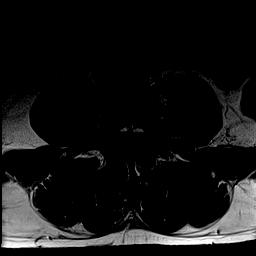
[im 17/36]
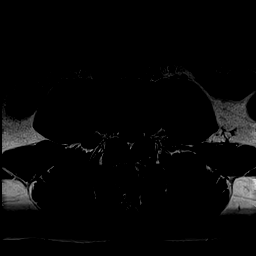
[im 19/36]
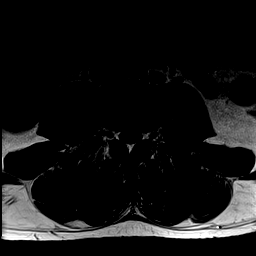
[im 22/36]
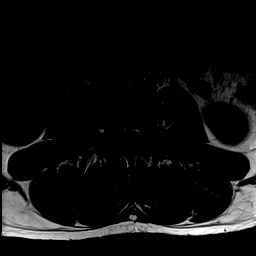
[im 26/36]
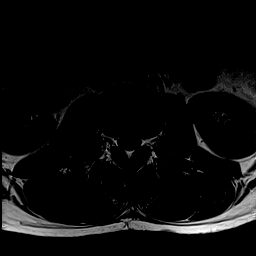
[im 31/36]
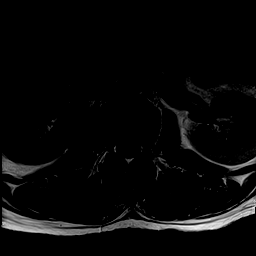
[im 36/36]
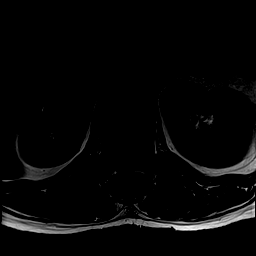

[Series 7: T2 · axial · 4.0mm · 0.78mm/px · z∈[-154,+49]mm · 16 of 36 slices shown (2 of 2)]
[im 1/36]
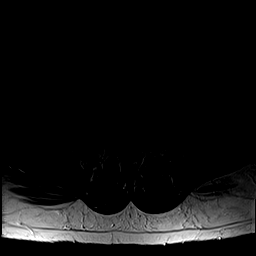
[im 3/36]
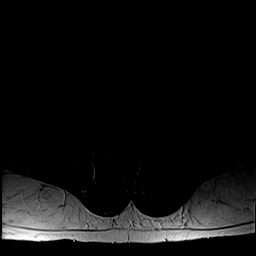
[im 5/36]
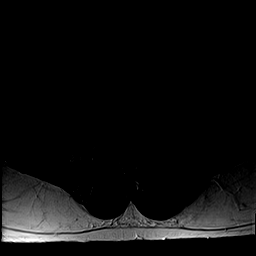
[im 8/36]
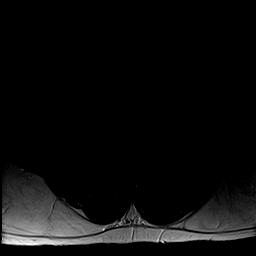
[im 10/36]
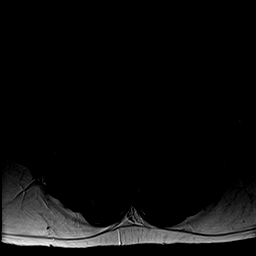
[im 12/36]
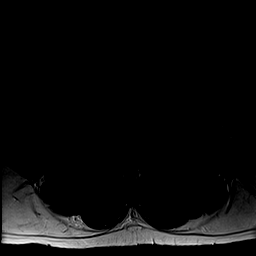
[im 15/36]
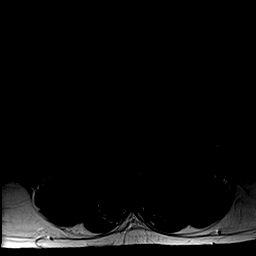
[im 17/36]
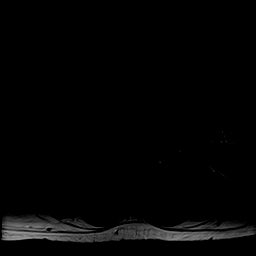
[im 19/36]
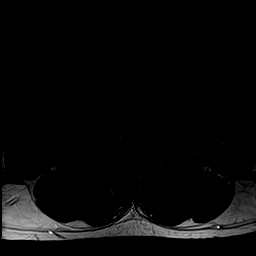
[im 22/36]
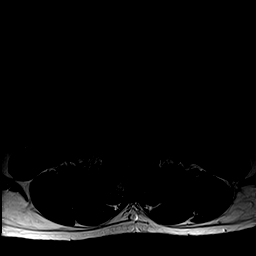
[im 24/36]
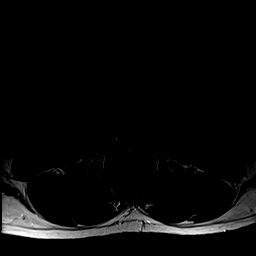
[im 26/36]
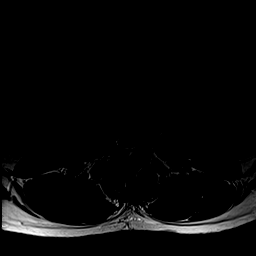
[im 29/36]
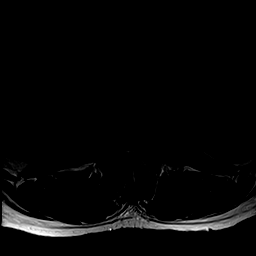
[im 31/36]
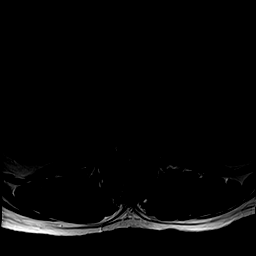
[im 33/36]
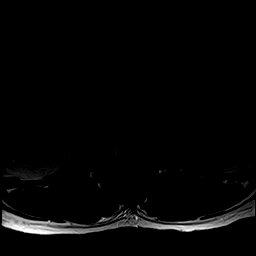
[im 36/36]
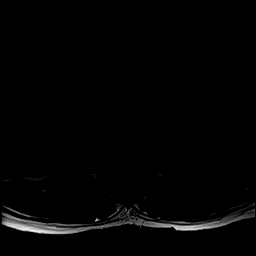

[45 of 48 positions shown; findings below may reference images not displayed]

FINDINGS: Segmentation:  Standard.

Alignment:  Maintained.

Vertebrae: No fracture. There is some marrow edema about the left
and right L4-5 facet joints.

Conus medullaris: Extends to the L1 level and appears normal.

Paraspinal and other soft tissues: Extensive edema is seen in
paraspinous musculature from the L1-2 level into the sacral
segments. Edematous change is worst from mid L3 to S1-2. Focal fluid
collections in paraspinous musculature are seen. On the left, the
collection is centered at the level of the L5-S1 facet joints and
measures 1.4 cm transverse by 2.1 cm by 5 cm craniocaudal.
Collection on the right is centered at the level of the L5 pedicles
measuring 1.6 cm transverse by 1.7 cm AP by 3.5 cm craniocaudal.
Edema is also seen in the posterior aspect of the left psoas muscle.

Disc levels:

A posterior epidural collection is seen extending from approximately
mid L4 to approximately S2. The collection measures up to 1.1 cm
transverse by 0.9 cm AP by 7.8 cm craniocaudal. The collection
causes marked compression of the thecal sac appearing worst from at
L4-5 to the S1-2 level. The entire extent of the collection is not
imaged in the axial plane.

Intervertebral discs appear normal from T12-L1 to L4-5. Broad-based
central protrusion at L5-S1 is identified. The foramina are open at
this level.
IMPRESSION: Extensive edema posterior paraspinous muscles with focal fluid
collections most worrisome for pyomyositis. Epidural fluid
collection as described above is most consistent with epidural
abscess and causes marked compression of the thecal sac from L4-5
into the sacral segments. Given history of fall, hematoma is
possible but highly unlikely.

Marrow edema in the L4-5 facet joints worrisome for osteomyelitis.

Broad-based central protrusion L5-S1 contributes to central canal
stenosis in conjunction with epidural fluid.

Critical Value/emergent results were called by telephone at the time
of interpretation on 12/03/2016 at [DATE] to Dr. ZACLE TERNURA , who
verbally acknowledged these results.

## 2018-07-06 ENCOUNTER — Emergency Department (HOSPITAL_COMMUNITY)
Admission: EM | Admit: 2018-07-06 | Discharge: 2018-07-06 | Disposition: A | Discharge status: Home / Self Care | Payer: MEDICAID

## 2018-07-06 NOTE — ED Notes (Signed)
Pt called x 3 for triage in waiting room and no response

## 2019-03-29 ENCOUNTER — Other Ambulatory Visit: Payer: Self-pay

## 2019-03-29 ENCOUNTER — Encounter (HOSPITAL_COMMUNITY): Payer: Self-pay

## 2019-03-29 ENCOUNTER — Ambulatory Visit (HOSPITAL_COMMUNITY)
Admission: EM | Admit: 2019-03-29 | Discharge: 2019-03-29 | Disposition: A | Discharge status: Home / Self Care | Payer: Self-pay | Attending: Internal Medicine | Admitting: Internal Medicine

## 2019-03-29 DIAGNOSIS — M5432 Sciatica, left side: Secondary | ICD-10-CM

## 2019-03-29 MED ORDER — PREDNISONE 10 MG (21) PO TBPK: ORAL_TABLET | ORAL | 0 refills | Status: AC

## 2019-03-29 MED ORDER — KETOROLAC TROMETHAMINE 60 MG/2ML IM SOLN
60.0000 mg | Freq: Once | INTRAMUSCULAR | Status: AC
  Administered 2019-03-29: 12:00:00 60 mg via INTRAMUSCULAR

## 2019-03-29 MED ORDER — KETOROLAC TROMETHAMINE 60 MG/2ML IM SOLN
INTRAMUSCULAR | Status: AC
  Filled 2019-03-29: qty 2

## 2019-03-29 MED ORDER — CYCLOBENZAPRINE HCL 10 MG PO TABS: 10.0000 mg | ORAL_TABLET | Freq: Two times a day (BID) | ORAL | 0 refills | Status: AC | PRN

## 2019-03-29 NOTE — ED Provider Notes (Signed)
[facility]    CSN: [id number] Arrival date & time: 03/29/19  1143     History   Chief Complaint Chief Complaint  Patient presents with  . Leg Pain    HPI Larry Best is a 33 y.o. male.   Patient is a 33 year old male with past medical history of MSSA, epidural absess and IV drug use.  He presents today with bilateral groin pain/hip pain and muscle spasming.  This is worse on the left.  Describes the pain as sharp stabbing, burning and running down both of his legs.  He is having muscle cramping and tightening of the muscles mostly in the left leg.  He does report a past medical history of sciatic nerve pain.  He also states that he has been pretty sedentary the last couple weeks being out of work.  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  He has not been drinking much fluids and staying hydrated.  He did take ibuprofen 800 mg which relieved his pain somewhat.  Denies any associated fevers, chills, body aches, weakness, saddle paresthesias, loss of bowel bladder function.  He is able to ambulate without issues.  Denies any current drug use. No recent long distance travels. No hx of DVT, PE. He does smoke.   ROS per HPI      Past Medical History:  Diagnosis Date  . IVDU (intravenous drug user) 12/04/2016  . MSSA (methicillin susceptible Staphylococcus aureus) infection 12/04/2016  . Staphylococcus aureus bacteremia 12/04/2016    Patient Active Problem List   Diagnosis Date Noted  . IVDU (intravenous drug user) 12/04/2016  . Staphylococcus aureus bacteremia 12/04/2016  . MSSA (methicillin susceptible Staphylococcus aureus) infection 12/04/2016  . Headache   . Pyomyositis   . Epidural abscess 12/03/2016  . Abscess in epidural space of lumbar spine 12/03/2016    Past Surgical History:  Procedure Laterality Date  . LUMBAR LAMINECTOMY FOR EPIDURAL ABSCESS N/A 12/03/2016   Procedure: DECOMPRESSIVE  LUMBAR LAMINECTOMY FOR EPIDURAL ABSCESS LUMBAR FOUR- SACRAL ONE;  Surgeon: Donalee Citrin, MD;  [facility] OR;  Service: Neurosurgery;  Laterality: N/A;  . TEE WITHOUT CARDIOVERSION N/A 12/09/2016   Procedure: TRANSESOPHAGEAL ECHOCARDIOGRAM (TEE);  Surgeon: Thurmon Fair, MD;  Location: [facility] ENDOSCOPY;  Service: Cardiovascular;  Laterality: N/A;       Home Medications    Prior to Admission medications   Medication Sig Start Date End Date Taking? Authorizing Provider  cyclobenzaprine (FLEXERIL) 10 MG tablet Take 1 tablet (10 mg total) by mouth 2 (two) times daily as needed for muscle spasms. 03/29/19   Dahlia Byes A, NP  ibuprofen (ADVIL,MOTRIN) 800 MG tablet Take 1 tablet (800 mg total) by mouth 3 (three) times daily. 11/28/16   Arnetha Courser, MD  predniSONE (STERAPRED UNI-PAK 21 TAB) 10 MG (21) TBPK tablet 6 tabs for 1 day, then 5 tabs for 1 das, then 4 tabs for 1 day, then 3 tabs for 1 day, 2 tabs for 1 day, then 1 tab for 1 day 03/29/19   Janace Aris, NP    Family History History reviewed. No pertinent family history.  Social History Social History   Tobacco Use  . Smoking status: Current Every Day Smoker    Packs/day: 0.50    Types: Cigarettes  . Smokeless tobacco: Never Used  Substance Use Topics  . Alcohol use: No  . Drug use: Yes    Types: Heroin, Marijuana  Comment: hx of heroin addiction     Allergies   Patient has no known allergies.   Review of Systems Review of Systems   Physical Exam Triage Vital Signs ED Triage Vitals  Enc Vitals Group     BP 03/29/19 1159 (!) 150/82     Pulse Rate 03/29/19 1159 99     Resp 03/29/19 1159 18     Temp 03/29/19 1159 97.6 F (36.4 C)     Temp Source 03/29/19 1159 Oral     SpO2 03/29/19 1159 100 %     Weight --      Height --      Head Circumference --      Peak Flow --      Pain Score 03/29/19 1157 10     Pain Loc --      Pain Edu? --      Excl. in GC? --    No data found.  Updated Vital Signs BP (!) 150/82  (BP Location: Right Arm)   Pulse 99   Temp 97.6 F (36.4 C) (Oral)   Resp 18   SpO2 100%   Visual Acuity Right Eye Distance:   Left Eye Distance:   Bilateral Distance:    Right Eye Near:   Left Eye Near:    Bilateral Near:     Physical Exam Vitals signs and nursing note reviewed.  Constitutional:      General: He is not in acute distress.    Appearance: He is not ill-appearing, toxic-appearing or diaphoretic.     Comments: Appears in pain  HENT:     Head: Normocephalic and atraumatic.     Nose: Nose normal.  Eyes:     Conjunctiva/sclera: Conjunctivae normal.  Neck:     Musculoskeletal: Normal range of motion.  Cardiovascular:     Pulses: Normal pulses.  Pulmonary:     Effort: Pulmonary effort is normal.  Musculoskeletal:        General: No swelling, deformity or signs of injury.     Right lower leg: No edema.     Left lower leg: No edema.     Comments: Normal range of motion in both legs and hips.  No crepitus. Positive straight leg raise on left No masses felt in left groin.  No adenopathy.  Mild tenderness Temperature, color of extremity normal No swelling, bruising or erythema Able to ambulate in room without difficulty Muscle spasming noted to left calf area, no swelling, erythema or increased warmth.  No pain with palpation of the lumbar spine or paravertebral musculature.   Skin:    General: Skin is warm and dry.  Neurological:     Mental Status: He is alert.  Psychiatric:        Mood and Affect: Mood normal.      UC Treatments / Results  Labs (all labs ordered are listed, but only abnormal results are displayed) Labs Reviewed - No data to display  EKG None  Radiology No results found.  Procedures Procedures (including critical care time)  Medications Ordered in UC Medications  ketorolac (TORADOL) injection 60 mg (60 mg Intramuscular Given 03/29/19 1224)    Initial Impression / Assessment and Plan / UC Course  I have reviewed the triage  vital signs and the nursing notes.  Pertinent labs & imaging results that were available during my care of the patient were reviewed by me and considered in my medical decision making (see chart for details).    Sciatic nerve  pain, muscle spasms, dehydration.   Symptoms consistent with muscle spasming and sciatic nerve pain Not convinced this is abscess or septic joint nor DVT Toradol injection given here in clinic Patient reported small decrease in pain after injection. Sending home with prednisone taper and Flexeril for muscle spasms.  Instructed that some of his muscle cramping and spasming could also be due to dehydration.  He reports he has not been drinking fluids appropriately. Instructed to get Gatorade and drink fluids with electrolytes, specifically potassium Strict precautions and instructions that if his symptoms continue or worsen over the next 24 to 48 hours despite treatment with the medication we have prescribed and he will need to go to the ER Patient understanding and agreed Final Clinical Impressions(s) / UC Diagnoses   Final diagnoses:  Sciatica of left side     Discharge Instructions     I am giving you Flexeril for muscles spasming.  You can take this twice a day as needed.  Be aware this make you drowsy. Steroid taper to take over the next 6 days.  Make sure you take this with food to avoid upset stomach.  Do not take any other NSAIDs while taking this medication. We gave you a Toradol injection here in clinic You can take Tylenol extra strength for pain if you need to Keep a close watch on this and if it does not somewhat improve in the next 24 to 48 hours or if he gets extremely worse you need to go to the ER for further evaluation and management    ED Prescriptions    Medication Sig Dispense Auth. Provider   cyclobenzaprine (FLEXERIL) 10 MG tablet Take 1 tablet (10 mg total) by mouth 2 (two) times daily as needed for muscle spasms. 20 tablet Meylin Stenzel A,  NP   predniSONE (STERAPRED UNI-PAK 21 TAB) 10 MG (21) TBPK tablet 6 tabs for 1 day, then 5 tabs for 1 das, then 4 tabs for 1 day, then 3 tabs for 1 day, 2 tabs for 1 day, then 1 tab for 1 day 21 tablet Jaci Lazier, Caili Escalera A, NP     Controlled Substance Prescriptions [place] Controlled Substance Registry consulted? Not Applicable   Janace Aris, NP 03/29/19 1357

## 2019-03-29 NOTE — ED Triage Notes (Signed)
Patient presents to Urgent Care with complaints of bilateral hip pain, worse on the left and radiating down both legs anteriorly since 4 months ago. Patient states he backed into a bar table a few months ago and that is what started this. Pt has "knots" running down both legs superficially, which are painful on his shins. Pt has not taken any otc meds today pta.

## 2019-03-29 NOTE — Discharge Instructions (Addendum)
I am giving you Flexeril for muscles spasming.  You can take this twice a day as needed.  Be aware this make you drowsy. Steroid taper to take over the next 6 days.  Make sure you take this with food to avoid upset stomach.  Do not take any other NSAIDs while taking this medication. We gave you a Toradol injection here in clinic You can take Tylenol extra strength for pain if you need to Keep a close watch on this and if it does not somewhat improve in the next 24 to 48 hours or if he gets extremely worse you need to go to the ER for further evaluation and management

## 2019-05-30 ENCOUNTER — Ambulatory Visit (HOSPITAL_COMMUNITY)
Admission: EM | Admit: 2019-05-30 | Discharge: 2019-05-30 | Disposition: A | Discharge status: Home / Self Care | Payer: Self-pay | Attending: Emergency Medicine | Admitting: Emergency Medicine

## 2019-05-30 ENCOUNTER — Other Ambulatory Visit: Payer: Self-pay

## 2019-05-30 ENCOUNTER — Encounter (HOSPITAL_COMMUNITY): Payer: Self-pay

## 2019-05-30 DIAGNOSIS — L02212 Cutaneous abscess of back [any part, except buttock]: Secondary | ICD-10-CM

## 2019-05-30 DIAGNOSIS — L0291 Cutaneous abscess, unspecified: Secondary | ICD-10-CM

## 2019-05-30 MED ORDER — IBUPROFEN 800 MG PO TABS
800.0000 mg | ORAL_TABLET | Freq: Three times a day (TID) | ORAL | 0 refills | Status: AC
Start: 2019-05-30 — End: ?

## 2019-05-30 MED ORDER — DOXYCYCLINE HYCLATE 100 MG PO CAPS: 100.0000 mg | ORAL_CAPSULE | Freq: Two times a day (BID) | ORAL | 0 refills | Status: AC

## 2019-05-30 MED ORDER — IBUPROFEN 800 MG PO TABS: 800.0000 mg | ORAL_TABLET | Freq: Three times a day (TID) | ORAL | 0 refills | Status: DC

## 2019-05-30 MED ORDER — LIDOCAINE HCL 2 % IJ SOLN
INTRAMUSCULAR | Status: AC
  Filled 2019-05-30: qty 20

## 2019-05-30 NOTE — Discharge Instructions (Signed)
Please begin doxycycline for 10 days ° °Apply warm compresses/hot rags to area with massage to express further drainage especially the first 24-48 hours ° °Return if symptoms returning or not improving  °

## 2019-05-30 NOTE — ED Provider Notes (Signed)
[facility]    CSN: [id number] Arrival date & time: 05/30/19  0900      History   Chief Complaint Chief Complaint  Patient presents with   Abscess    HPI Larry Best is a 33 y.o. male history of previous IV drug use, MRSA, presenting today for evaluation of abscesses.  Patient states that over the past week he has developed abscesses to his upper back on the left and right side.  These have become very painful and feels as if there brought to burst.  He is also noticed an area to his left inner knee/calf that has developed over the past couple days.  He notes that he believed to have multiple tick bites prior to this.  Denies continued use of IV drug use.  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    Past Surgical History:  Procedure Laterality Date   LUMBAR LAMINECTOMY FOR EPIDURAL ABSCESS N/A 12/03/2016   Procedure: DECOMPRESSIVE LUMBAR LAMINECTOMY FOR EPIDURAL ABSCESS LUMBAR FOUR- SACRAL ONE;  Surgeon: Kary Kos, MD;  Location: [facility];  Service: Neurosurgery;  Laterality: N/A;   TEE WITHOUT CARDIOVERSION N/A 12/09/2016   Procedure: TRANSESOPHAGEAL ECHOCARDIOGRAM (TEE);  Surgeon: Sanda Klein, MD;  Location: [facility] ENDOSCOPY;  Service: Cardiovascular;  Laterality: N/A;       Home Medications    Prior to Admission medications   Medication Sig Start Date End Date Taking? Authorizing Provider  cyclobenzaprine (FLEXERIL) 10 MG tablet Take 1 tablet (10  mg total) by mouth 2 (two) times daily as needed for muscle spasms. 03/29/19   Loura Halt A, NP  doxycycline (VIBRAMYCIN) 100 MG capsule Take 1 capsule (100 mg total) by mouth 2 (two) times daily for 10 days. 05/30/19 06/09/19  Rayah Fines C, PA-C  ibuprofen (ADVIL) 800 MG tablet Take 1 tablet (800 mg total) by mouth 3 (three) times daily. 05/30/19   Lamari Beckles C, PA-C  predniSONE (STERAPRED UNI-PAK 21 TAB) 10 MG (21) TBPK tablet 6 tabs for 1 day, then 5 tabs for 1 das, then 4 tabs for 1 day, then 3 tabs for 1 day, 2 tabs for 1 day, then 1 tab for 1 day 03/29/19   Orvan July, NP    Family History Family History  Family history unknown: Yes    Social History Social History   Tobacco Use   Smoking status: Current Every Day Smoker    Packs/day: 0.50    Types: Cigarettes   Smokeless tobacco: Never Used  Substance Use Topics   Alcohol use: No   Drug use: Yes    Types: Heroin, Marijuana    Comment: hx of heroin addiction     Allergies   Patient has no known allergies.   Review of Systems Review of Systems  Constitutional: Negative for fatigue and fever.  Eyes: Negative for redness, itching and visual disturbance.  Respiratory: Negative for shortness of breath.   Cardiovascular: Negative for chest pain and leg swelling.  Gastrointestinal: Negative for nausea and vomiting.  Musculoskeletal: Negative  for arthralgias and myalgias.  Skin: Positive for color change and wound. Negative for rash.  Neurological: Negative for dizziness, syncope, weakness, light-headedness and headaches.     Physical Exam Triage Vital Signs ED Triage Vitals  Enc Vitals Group     BP 05/30/19 0914 (!) 150/71     Pulse Rate 05/30/19 0914 (!) 116     Resp 05/30/19 0914 20     Temp 05/30/19 0914 98.8 F (37.1 C)     Temp Source 05/30/19 0914 Oral     SpO2 05/30/19 0914 100 %     Weight --      Height --      Head Circumference --      Peak Flow --      Pain Score 05/30/19 0916 9      Pain Loc --      Pain Edu? --      Excl. in GC? --    No data found.  Updated Vital Signs BP (!) 150/71 (BP Location: Left Arm)    Pulse (!) 116    Temp 98.8 F (37.1 C) (Oral)    Resp 20    SpO2 100%  Heart rate rechecked prior to discharge and was hovering around 104 Visual Acuity Right Eye Distance:   Left Eye Distance:   Bilateral Distance:    Right Eye Near:   Left Eye Near:    Bilateral Near:     Physical Exam Vitals signs and nursing note reviewed.  Constitutional:      Appearance: He is well-developed.     Comments: No acute distress  HENT:     Head: Normocephalic and atraumatic.     Nose: Nose normal.  Eyes:     Conjunctiva/sclera: Conjunctivae normal.  Neck:     Musculoskeletal: Neck supple.  Cardiovascular:     Rate and Rhythm: Tachycardia present.  Pulmonary:     Effort: Pulmonary effort is normal. No respiratory distress.  Abdominal:     General: There is no distension.  Musculoskeletal: Normal range of motion.     Comments: Full active range of motion of knee, ambulating with slight antalgia  Full active range of motion of upper extremities  Skin:    General: Skin is warm and dry.     Comments: Bilateral upper thoracic areas with approximately 5 cm circular areas of erythema and induration with central scabbing and small areas of superficial pus noted  Left medial knee/proximal lower leg with area of faint erythema with central skin peeling and drainage of clear material, tender to touch  Neurological:     Mental Status: He is alert and oriented to person, place, and time.      UC Treatments / Results  Labs (all labs ordered are listed, but only abnormal results are displayed) Labs Reviewed - No data to display  EKG None  Radiology No results found.  Procedures Incision and Drainage  Date/Time: 05/30/2019 10:18 AM Performed by: Edee Nifong, JoesHallie C, PA-C Authorized by: Keara Pagliarulo, Junius CreamerHallie C, PA-C   Consent:    Consent obtained:  Verbal    Consent given by:  Patient   Risks discussed:  Bleeding, incomplete drainage and pain   Alternatives discussed:  Alternative treatment Location:    Type:  Abscess   Size:  4   Location:  Trunk   Trunk location:  Back (left) Pre-procedure details:    Skin preparation:  Betadine Anesthesia (see MAR for exact dosages):    Anesthesia method:  Local infiltration  Local anesthetic:  Lidocaine 2% w/o epi Procedure type:    Complexity:  Simple Procedure details:    Incision types:  Single straight   Incision depth:  Subcutaneous   Scalpel blade:  11   Drainage:  Bloody and purulent   Drainage amount: mild.   Wound treatment:  Wound left open   Packing materials:  None Post-procedure details:    Patient tolerance of procedure:  Tolerated well, no immediate complications Incision and Drainage  Date/Time: 05/30/2019 10:19 AM Performed by: Kentley Blyden, [place] Lake HillsHallie C, PA-C Authorized by: Ottie Neglia, Junius CreamerHallie C, PA-C   Consent:    Consent obtained:  Verbal   Consent given by:  Patient   Risks discussed:  Bleeding, incomplete drainage and pain   Alternatives discussed:  Alternative treatment Location:    Type:  Abscess   Size:  5   Location:  Trunk   Trunk location:  Back (right) Pre-procedure details:    Skin preparation:  Betadine Anesthesia (see MAR for exact dosages):    Anesthesia method:  Local infiltration   Local anesthetic:  Lidocaine 2% w/o epi Procedure type:    Complexity:  Simple Procedure details:    Incision types:  Single straight   Incision depth:  Subcutaneous   Scalpel blade:  11   Wound management:  Probed and deloculated   Drainage:  Bloody   Drainage amount:  Scant   Wound treatment:  Wound left open   Packing materials:  None Post-procedure details:    Patient tolerance of procedure:  Tolerated well, no immediate complications   (including critical care time)  Medications Ordered in UC Medications  lidocaine (XYLOCAINE) 2 % (with pres) injection (has no  administration in time range)    Initial Impression / Assessment and Plan / UC Course  I have reviewed the triage vital signs and the nursing notes.  Pertinent labs & imaging results that were available during my care of the patient were reviewed by me and considered in my medical decision making (see chart for details).  Clinical Course as of May 30 1015  Wed May 30, 2019  0958 HR rechecked prior to discharge 102   [HW]    Clinical Course User Index [HW] Shawntavia Saunders, [place]ie C, [place]PA-C    Patient with abscesses to bilateral back attempted I&D, drainage on left attempted first with some mild drainage obtained, repeated on the right.  Abscess on leg does not appear fluctuant at this time will defer I&D.  Will initiate on doxycycline to cover for MRSA, continue warm compresses.  Monitor for gradual resolution.Discussed strict return precautions. Patient verbalized understanding and is agreeable with plan.  Final Clinical Impressions(s) / UC Diagnoses   Final diagnoses:  Abscess     Discharge Instructions     Please begin doxycycline for 10 days  Apply warm compresses/hot rags to area with massage to express further drainage especially the first 24-48 hours  Return if symptoms returning or not improving   ED Prescriptions    Medication Sig Dispense Auth. Provider   doxycycline (VIBRAMYCIN) 100 MG capsule Take 1 capsule (100 mg total) by mouth 2 (two) times daily for 10 days. 20 capsule Ashlinn Hemrick C, PA-C   ibuprofen (ADVIL) 800 MG tablet  (Status: Discontinued) Take 1 tablet (800 mg total) by mouth 3 (three) times daily. 21 tablet Tanaisha Pittman C, PA-C   ibuprofen (ADVIL) 800 MG tablet Take 1 tablet (800 mg total) by mouth 3 (three) times daily. 21 tablet Jacquelyn Shadrick, [place]ie C, PA-C  Controlled Substance Prescriptions [place] Controlled Substance Registry consulted? Not Applicable   Lew DawesWieters, Getsemani Lindon C, [place]PA-C 05/30/19 1022

## 2019-05-30 NOTE — ED Triage Notes (Signed)
Pt presents with an abscess on back of left, shoulder, back of right shoulder, and left calf X 7 days.
